# Patient Record
Sex: Female | Born: 1965 | Race: White | Hispanic: No | Marital: Single | Smoking: Current every day smoker
Health system: Southern US, Community
[De-identification: ages and names within clinical notes are randomized; demographics above are authoritative.]

## PROBLEM LIST (undated history)

## (undated) DIAGNOSIS — F419 Anxiety disorder, unspecified: Secondary | ICD-10-CM

## (undated) DIAGNOSIS — E039 Hypothyroidism, unspecified: Secondary | ICD-10-CM

## (undated) DIAGNOSIS — I10 Essential (primary) hypertension: Secondary | ICD-10-CM

## (undated) DIAGNOSIS — F32A Depression, unspecified: Secondary | ICD-10-CM

## (undated) DIAGNOSIS — F329 Major depressive disorder, single episode, unspecified: Secondary | ICD-10-CM

## (undated) DIAGNOSIS — M549 Dorsalgia, unspecified: Secondary | ICD-10-CM

## (undated) DIAGNOSIS — G40909 Epilepsy, unspecified, not intractable, without status epilepticus: Secondary | ICD-10-CM

## (undated) DIAGNOSIS — M797 Fibromyalgia: Secondary | ICD-10-CM

## (undated) DIAGNOSIS — G8929 Other chronic pain: Secondary | ICD-10-CM

## (undated) DIAGNOSIS — M1990 Unspecified osteoarthritis, unspecified site: Secondary | ICD-10-CM

## (undated) HISTORY — DX: Fibromyalgia: M79.7

## (undated) HISTORY — DX: Unspecified osteoarthritis, unspecified site: M19.90

## (undated) HISTORY — PX: APPENDECTOMY: SHX54

## (undated) HISTORY — PX: OTHER SURGICAL HISTORY: SHX169

## (undated) HISTORY — DX: Epilepsy, unspecified, not intractable, without status epilepticus: G40.909

## (undated) HISTORY — PX: ABDOMINAL HYSTERECTOMY: SHX81

## (undated) HISTORY — PX: CHOLECYSTECTOMY: SHX55

## (undated) HISTORY — PX: TONSILLECTOMY: SUR1361

## (undated) HISTORY — PX: BACK SURGERY: SHX140

---

## 2011-04-16 ENCOUNTER — Encounter: Payer: Self-pay | Admitting: Family Medicine

## 2011-04-16 ENCOUNTER — Emergency Department (HOSPITAL_COMMUNITY)
Admission: EM | Admit: 2011-04-16 | Discharge: 2011-04-16 | Disposition: A | Discharge status: Home / Self Care | Payer: BC Managed Care – PPO | Attending: Emergency Medicine | Admitting: Emergency Medicine

## 2011-04-16 DIAGNOSIS — G8929 Other chronic pain: Secondary | ICD-10-CM

## 2011-04-16 DIAGNOSIS — R079 Chest pain, unspecified: Secondary | ICD-10-CM | POA: Insufficient documentation

## 2011-04-16 DIAGNOSIS — R0602 Shortness of breath: Secondary | ICD-10-CM | POA: Insufficient documentation

## 2011-04-16 DIAGNOSIS — F410 Panic disorder [episodic paroxysmal anxiety] without agoraphobia: Secondary | ICD-10-CM | POA: Insufficient documentation

## 2011-04-16 DIAGNOSIS — F319 Bipolar disorder, unspecified: Secondary | ICD-10-CM | POA: Insufficient documentation

## 2011-04-16 DIAGNOSIS — F419 Anxiety disorder, unspecified: Secondary | ICD-10-CM

## 2011-04-16 DIAGNOSIS — F341 Dysthymic disorder: Secondary | ICD-10-CM | POA: Insufficient documentation

## 2011-04-16 DIAGNOSIS — F32A Depression, unspecified: Secondary | ICD-10-CM

## 2011-04-16 DIAGNOSIS — Z79899 Other long term (current) drug therapy: Secondary | ICD-10-CM | POA: Insufficient documentation

## 2011-04-16 DIAGNOSIS — R000 Tachycardia, unspecified: Secondary | ICD-10-CM | POA: Insufficient documentation

## 2011-04-16 DIAGNOSIS — F329 Major depressive disorder, single episode, unspecified: Secondary | ICD-10-CM

## 2011-04-16 HISTORY — DX: Other chronic pain: G89.29

## 2011-04-16 HISTORY — DX: Dorsalgia, unspecified: M54.9

## 2011-04-16 HISTORY — DX: Depression, unspecified: F32.A

## 2011-04-16 HISTORY — DX: Major depressive disorder, single episode, unspecified: F32.9

## 2011-04-16 HISTORY — DX: Anxiety disorder, unspecified: F41.9

## 2011-04-16 MED ORDER — ALPRAZOLAM 0.5 MG PO TABS
1.0000 mg | ORAL_TABLET | Freq: Once | ORAL | Status: AC
  Administered 2011-04-16: 1 mg via ORAL
  Filled 2011-04-16 (×2): qty 1

## 2011-04-16 MED ORDER — SERTRALINE HCL 100 MG PO TABS: 100.0000 mg | ORAL_TABLET | Freq: Every day | ORAL | Status: DC

## 2011-04-16 MED ORDER — ALPRAZOLAM 1 MG PO TABS: ORAL_TABLET | ORAL | Status: DC

## 2011-04-16 NOTE — ED Provider Notes (Signed)
11:29 AM  Date: 04/16/2011  Rate: 81  Rhythm: normal sinus rhythm  QRS Axis: normal  Intervals: normal  ST/T Wave abnormalities: nonspecific T wave changes  Conduction Disutrbances:none  Narrative Interpretation: Borderline EKG.  Old EKG Reviewed: none available  Osvaldo Human, M.D.   Carleene Cooper III, MD 04/17/11 [phone]

## 2011-04-16 NOTE — ED Notes (Signed)
Per EMS, pt chest pain that started yesterday. sts related to stress due to pt husband killing himself. Per EMS pt brother wants her to get help with depression and taking pills

## 2011-04-16 NOTE — ED Provider Notes (Signed)
History     CSN: [id number] Arrival date & time: 04/16/2011 10:53 AM   First MD Initiated Contact with Patient 04/16/11 1146      Chief Complaint  Patient presents with  . Chest Pain    (Consider location/radiation/quality/duration/timing/severity/associated sxs/prior treatment) HPI  Patient presents to emergency department by EMS with her brother at bedside with complaint of anxiety attack. Patient states she has a long history of anxiety, depression, and bipolar disorder as well as chronic pain. Patient states that she has recently moved to [place] from [place] within the last month due to the death of her mother and the death of her husband in 02-01-2023. Patient states since the recent death increasing anxiety and depression. Patient states she's been out of her Zoloft and her Xanax to due to changes in insurance over the last few weeks which has caused increasing anxiety and depression. Patient states that today she was at home were she lives alone and became very overwhelmed suddenly describing an anxiety attack to include shortness of breath, feeling panicked, heart racing, and a pounding in her chest. Patient states similar symptoms in the past when having anxiety attacks. Patient states she has been on daily chronic pain medicines for history of back and leg pain. Patient takes daily morphine, oxycodone, and soma. Patient states she has all her medicines and refills except for her Zoloft and her sertraline. Patient's brother who lives in [place] who is providing much care for the patient since her move is at bedside and states he is concerned that the patient is becoming addicted to her chronic pain medicine. Patient states that she would like refills of her Zoloft and her zoloft. Patient's brother states that he would like resources for information about chronic pain management. Both patient and her brother states that patient feels safe at home and she denies suicidal or  homicidal ideation. Patient states increasing daily anxiety and depression with acute onset anxiety attack which has since resolved. Patient states she is feeling better.  Past Medical History  Diagnosis Date  . Depression   . Anxiety   . Chronic back pain     Past Surgical History  Procedure Date  . Cholecystectomy   . Back surgery   . Tonsillectomy     History reviewed. No pertinent family history.  History  Substance Use Topics  . Smoking status: Current Everyday Smoker  . Smokeless tobacco: Never Used  . Alcohol Use: Yes    OB History    Grav Para Term Preterm Abortions TAB SAB Ect Mult Living                  Review of Systems  All other systems reviewed and are negative.    Allergies  Buspar  Home Medications  No current outpatient prescriptions on file.  BP 124/76  Pulse 88  Temp(Src) 97.9 F (36.6 C) (Oral)  Resp 25  SpO2 98%  Physical Exam  Nursing note and vitals reviewed. Constitutional: She is oriented to person, place, and time. She appears well-developed and well-nourished. No distress.  HENT:  Head: Normocephalic and atraumatic.  Eyes: Conjunctivae are normal.  Neck: Normal range of motion. Neck supple.  Cardiovascular: Normal rate, regular rhythm, normal heart sounds and intact distal pulses.  Exam reveals no gallop and no friction rub.   No murmur heard. Pulmonary/Chest: Effort normal and breath sounds normal. No respiratory distress. She has no wheezes. She has no rales. She exhibits no tenderness.  Abdominal: Bowel  sounds are normal. She exhibits no distension and no mass. There is no tenderness. There is no rebound and no guarding.  Musculoskeletal: Normal range of motion. She exhibits no edema and no tenderness.  Neurological: She is alert and oriented to person, place, and time.  Skin: Skin is warm and dry. No rash noted. She is not diaphoretic. No erythema.  Psychiatric: Her speech is normal. Thought content normal. Her mood  appears anxious. Cognition and memory are not impaired. She does not express impulsivity or inappropriate judgment. She expresses no homicidal and no suicidal ideation.    ED Course  Procedures (including critical care time)  PO xanax  Labs Reviewed - No data to display No results found.   1. Anxiety   2. Depression   3. Chronic pain       MDM  Patient has close family support with brother at bedside and states she feels safe at home denying SI or HI and brother stating that patient is safe at home but needing resources to further discuss chronic pain management. Patient has private insurance that recently was improved and has been seeing a psychologist in town and aggreeable to continuing and following up with psychiatry, PCP and pain management. Patient's only prescriptions that are currently out are zoloft and xanax with patient having a large supply of pain meds and refills on the remainder of her meds.    Medical screening examination/treatment/procedure(s) were performed by non-physician practitioner and as supervising physician I was immediately available for consultation/collaboration. Osvaldo Human, M.D.       [place], [place] 04/16/11 1247  Carleene Cooper III, MD 04/17/11 [phone]

## 2011-04-16 NOTE — ED Notes (Signed)
Pt sts that she is unable to eat or sleep and her heart is racing due to stress and anxiety r/t husbands death

## 2011-04-26 ENCOUNTER — Emergency Department (EMERGENCY_DEPARTMENT_HOSPITAL)
Admission: EM | Admit: 2011-04-26 | Discharge: 2011-04-27 | Disposition: A | Discharge status: Other Acute Inpatient Hospital | Payer: BC Managed Care – PPO | Source: Home / Self Care | Attending: Emergency Medicine | Admitting: Emergency Medicine

## 2011-04-26 ENCOUNTER — Encounter (HOSPITAL_COMMUNITY): Payer: Self-pay | Admitting: Adult Health

## 2011-04-26 ENCOUNTER — Emergency Department (HOSPITAL_COMMUNITY): Payer: BC Managed Care – PPO

## 2011-04-26 DIAGNOSIS — R05 Cough: Secondary | ICD-10-CM

## 2011-04-26 DIAGNOSIS — G8929 Other chronic pain: Secondary | ICD-10-CM

## 2011-04-26 DIAGNOSIS — F329 Major depressive disorder, single episode, unspecified: Secondary | ICD-10-CM

## 2011-04-26 DIAGNOSIS — R197 Diarrhea, unspecified: Secondary | ICD-10-CM

## 2011-04-26 DIAGNOSIS — F419 Anxiety disorder, unspecified: Secondary | ICD-10-CM

## 2011-04-26 DIAGNOSIS — R45851 Suicidal ideations: Secondary | ICD-10-CM

## 2011-04-26 LAB — COMPREHENSIVE METABOLIC PANEL
AST: 18 U/L (ref 0–37)
Albumin: 3.2 g/dL — ABNORMAL LOW (ref 3.5–5.2)
Alkaline Phosphatase: 257 U/L — ABNORMAL HIGH (ref 39–117)
BUN: 3 mg/dL — ABNORMAL LOW (ref 6–23)
Creatinine, Ser: 0.55 mg/dL (ref 0.50–1.10)
Potassium: 3 mEq/L — ABNORMAL LOW (ref 3.5–5.1)
Total Protein: 6.8 g/dL (ref 6.0–8.3)

## 2011-04-26 LAB — ACETAMINOPHEN LEVEL: Acetaminophen (Tylenol), Serum: <15 ug/mL (ref 10–30)

## 2011-04-26 LAB — CBC
HCT: 44.3 % (ref 36.0–46.0)
MCHC: 31.2 g/dL (ref 30.0–36.0)
Platelets: 191 10*3/uL (ref 150–400)
RDW: 14.7 % (ref 11.5–15.5)
WBC: 8.3 10*3/uL (ref 4.0–10.5)

## 2011-04-26 LAB — ETHANOL: Alcohol, Ethyl (B): <11 mg/dL (ref 0–11)

## 2011-04-26 LAB — RAPID URINE DRUG SCREEN, HOSP PERFORMED
Amphetamines: NOT DETECTED
Benzodiazepines: POSITIVE — AB
Cocaine: NOT DETECTED

## 2011-04-26 MED ORDER — POTASSIUM CHLORIDE 20 MEQ/15ML (10%) PO LIQD
40.0000 meq | Freq: Once | ORAL | Status: AC
  Administered 2011-04-26: 40 meq via ORAL
  Filled 2011-04-26: qty 30

## 2011-04-26 MED ORDER — ALPRAZOLAM 1 MG PO TABS
1.0000 mg | ORAL_TABLET | Freq: Four times a day (QID) | ORAL | Status: DC | PRN
  Administered 2011-04-26: 1 mg via ORAL
  Filled 2011-04-26: qty 1

## 2011-04-26 MED ORDER — ZOLPIDEM TARTRATE 5 MG PO TABS
5.0000 mg | ORAL_TABLET | Freq: Every evening | ORAL | Status: DC | PRN
  Administered 2011-04-26: 5 mg via ORAL
  Filled 2011-04-26: qty 1

## 2011-04-26 MED ORDER — LIOTHYRONINE SODIUM 25 MCG PO TABS
25.0000 ug | ORAL_TABLET | Freq: Every day | ORAL | Status: DC
  Administered 2011-04-26 – 2011-04-27 (×2): 25 ug via ORAL
  Filled 2011-04-26 (×3): qty 1

## 2011-04-26 MED ORDER — SODIUM CHLORIDE 0.9 % IV BOLUS (SEPSIS)
1000.0000 mL | Freq: Once | INTRAVENOUS | Status: AC
  Administered 2011-04-26: 1000 mL via INTRAVENOUS

## 2011-04-26 MED ORDER — ACETAMINOPHEN 325 MG PO TABS: 650.0000 mg | ORAL_TABLET | ORAL | Status: DC | PRN

## 2011-04-26 MED ORDER — ONDANSETRON HCL 4 MG/2ML IJ SOLN
4.0000 mg | Freq: Once | INTRAMUSCULAR | Status: AC
  Administered 2011-04-26: 4 mg via INTRAVENOUS
  Filled 2011-04-26: qty 2

## 2011-04-26 MED ORDER — LEVOTHYROXINE SODIUM 200 MCG PO TABS
200.0000 ug | ORAL_TABLET | Freq: Every day | ORAL | Status: DC
  Administered 2011-04-26 – 2011-04-27 (×2): 200 ug via ORAL
  Filled 2011-04-26 (×3): qty 1

## 2011-04-26 MED ORDER — ALUM & MAG HYDROXIDE-SIMETH 200-200-20 MG/5ML PO SUSP
30.0000 mL | ORAL | Status: DC | PRN
  Administered 2011-04-27: 30 mL via ORAL
  Filled 2011-04-26: qty 30

## 2011-04-26 MED ORDER — KETOROLAC TROMETHAMINE 30 MG/ML IJ SOLN
30.0000 mg | Freq: Once | INTRAMUSCULAR | Status: AC
  Administered 2011-04-26: 30 mg via INTRAVENOUS
  Filled 2011-04-26: qty 1

## 2011-04-26 MED ORDER — OXYCODONE-ACETAMINOPHEN 5-325 MG PO TABS
1.0000 | ORAL_TABLET | ORAL | Status: DC | PRN
  Administered 2011-04-26 – 2011-04-27 (×4): 1 via ORAL
  Filled 2011-04-26 (×4): qty 1

## 2011-04-26 MED ORDER — IBUPROFEN 600 MG PO TABS
600.0000 mg | ORAL_TABLET | Freq: Three times a day (TID) | ORAL | Status: DC | PRN
  Administered 2011-04-26: 600 mg via ORAL
  Filled 2011-04-26: qty 1

## 2011-04-26 MED ORDER — LORAZEPAM 1 MG PO TABS: 1.0000 mg | ORAL_TABLET | Freq: Three times a day (TID) | ORAL | Status: DC | PRN

## 2011-04-26 MED ORDER — SODIUM CHLORIDE 0.9 % IV BOLUS (SEPSIS)
1000.0000 mL | Freq: Once | INTRAVENOUS | Status: AC
  Administered 2011-04-26: 2000 mL via INTRAVENOUS

## 2011-04-26 MED ORDER — ONDANSETRON HCL 4 MG PO TABS
4.0000 mg | ORAL_TABLET | Freq: Three times a day (TID) | ORAL | Status: DC | PRN
  Administered 2011-04-27: 4 mg via ORAL
  Filled 2011-04-26: qty 1

## 2011-04-26 MED ORDER — NICOTINE 21 MG/24HR TD PT24
21.0000 mg | MEDICATED_PATCH | Freq: Every day | TRANSDERMAL | Status: DC
  Administered 2011-04-27: 21 mg via TRANSDERMAL
  Filled 2011-04-26: qty 1

## 2011-04-26 NOTE — ED Notes (Signed)
Per [facility], "upon arrival pt was warm & dry, not incontinent, all VS stable/WNL, pupils equal & reactive to light, would not initially allow EMS to open eyes, brother informed EMS her husband killed himself in Sept & she's been wanting to get help for wks"

## 2011-04-26 NOTE — ED Notes (Signed)
Pt states she has been depressed since her husband committed suicide on Sept 27th.  Denies SI/HI.

## 2011-04-26 NOTE — ED Notes (Signed)
Pt c/o that she has the flu, she has no strength, pt is tearful, crying, stating that her husband died and she can't stand being in the house. Keeps repeating, "I don't know what to do"
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
## 2011-04-26 NOTE — ED Notes (Signed)
[id number]<WU> Expected date:04/26/11<BR> Expected time:12:17 PM<BR> Means of arrival:Ambulance<BR> Comments:<BR> M41 - 45yoF suicidal

## 2011-04-26 NOTE — ED Notes (Signed)
One bag belongings placed in activity room.

## 2011-04-26 NOTE — ED Provider Notes (Signed)
History     CSN: [id number] Arrival date & time: 04/26/2011 12:32 PM   First MD Initiated Contact with Patient 04/26/11 1344      No chief complaint on file.   HPI  45yoF history of depression, anxiety presents with multiple complaints. The patient states that since with the suicide of her husband she has been feeling more depressed and more anxious. She states "I can't take it anymore" she states that she ran out of her Xanax approximately 2 weeks ago. She has thoughts of wanting to harm herself. She denies homicidal ideation. She denies auditory or visual hallucinations. She also states that for the last 2 weeks she has been doing with multiple episodes of watery diarrhea per day. She denies blood in her stool. She's had nausea and vomiting. +NP cough. Her last episode of nonbilious nonbloody emesis was yesterday. She denies abdominal pain. She does complain of diffuse body aches. She's having a mild diffuse headache, sore throat for the past 2 weeks as well. She has chills, no fever. No sick contacts.  ED Notes, ED Provider Notes from 04/26/11 0000 to 04/26/11 12:48:16       Janett Billow Delories Heinz, RN 04/26/2011 12:46      Pt c/o that she has the flu, she has no strength, pt is tearful, crying, stating that her husband died and she can't stand being in the house. Keeps repeating, "I don't know what to do"         Dixie Dials, RN 04/26/2011 12:32      [id number]  Expected date:04/26/11  Expected time:12:17 PM  Means of arrival:Ambulance  Comments:  M41 - 45yoF suicidal         Dixie Dials, RN 04/26/2011 12:32      Per [facility], "upon arrival pt was warm & dry, not incontinent, all VS stable/WNL, pupils equal & reactive to light, would not initially allow EMS to open eyes, brother informed EMS her husband killed himself in Sept & she's been wanting to get help for wks"    Past Medical History  Diagnosis Date  . Depression   . Anxiety   . Chronic back pain     Past  Surgical History  Procedure Date  . Cholecystectomy   . Back surgery   . Tonsillectomy   . Appendectomy     History reviewed. No pertinent family history.  History  Substance Use Topics  . Smoking status: Current Everyday Smoker  . Smokeless tobacco: Never Used  . Alcohol Use: Yes    OB History    Grav Para Term Preterm Abortions TAB SAB Ect Mult Living                  Review of Systems  All other systems reviewed and are negative.  except as noted HPI  Allergies  Buspar  Home Medications   Current Outpatient Rx  Name Route Sig Dispense Refill  . ALPRAZOLAM 1 MG PO TABS  1 tab by mouth every 6 hours as needed 30 tablet 0  . CARISOPRODOL 350 MG PO TABS Oral Take 350 mg by mouth 4 (four) times daily as needed. As needed for muscle spasms.     . CETIRIZINE HCL 10 MG PO TABS Oral Take 10 mg by mouth daily.      Marland Kitchen LEVOTHYROXINE SODIUM 200 MCG PO TABS Oral Take 200 mcg by mouth daily.      Marland Kitchen LIOTHYRONINE SODIUM 25 MCG PO TABS Oral Take 25 mcg by  mouth daily.      . MORPHINE SULFATE ER 60 MG PO TB12 Oral Take 60 mg by mouth 2 (two) times daily as needed. For pain    . OXYCODONE HCL 15 MG PO TABS Oral Take 15 mg by mouth every 4 (four) hours as needed. For breakthrough pain.     . ALBUTEROL SULFATE HFA 108 (90 BASE) MCG/ACT IN AERS Inhalation Inhale 2 puffs into the lungs every 4 (four) hours as needed. As needed for wheezing.     . DESOXIMETASONE 0.05 % EX CREA Topical Apply 1 application topically 2 (two) times daily as needed. Apply to toes as needed for irritation.     Marland Kitchen POTASSIUM CHLORIDE 10 MEQ PO TBCR Oral Take 20 mEq by mouth daily.      . QUETIAPINE FUMARATE 50 MG PO TABS Oral Take 50 mg by mouth at bedtime.      . SERTRALINE HCL 100 MG PO TABS Oral Take 100 mg by mouth daily.      . TOPIRAMATE 100 MG PO TABS Oral Take 100-200 mg by mouth 2 (two) times daily. Take one tablet in the morning and two tablets every night at bedtime.       BP 115/72  Pulse 60   Temp(Src) 97.9 F (36.6 C) (Oral)  Resp 18  SpO2 95%  Physical Exam  Nursing note and vitals reviewed. Constitutional: She is oriented to person, place, and time. She appears well-developed.  HENT:  Head: Atraumatic.  Mouth/Throat: Oropharynx is clear and moist. No oropharyngeal exudate.       Mm dry  Eyes: Conjunctivae and EOM are normal. Pupils are equal, round, and reactive to light.  Neck: Normal range of motion. Neck supple.  Cardiovascular: Normal rate, regular rhythm, normal heart sounds and intact distal pulses.   Pulmonary/Chest: Effort normal and breath sounds normal. No respiratory distress. She has no wheezes. She has no rales.  Abdominal: Soft. She exhibits no distension. There is no tenderness. There is no rebound and no guarding.  Musculoskeletal: Normal range of motion.       No muscular ttp  Neurological: She is alert and oriented to person, place, and time.  Skin: Skin is warm and dry. No rash noted.  Psychiatric: She has a normal mood and affect.    ED Course  Procedures (including critical care time)  Labs Reviewed  COMPREHENSIVE METABOLIC PANEL - Abnormal; Notable for the following:    Potassium 3.0 (*)    Glucose, Bld 101 (*)    BUN 3 (*)    Albumin 3.2 (*)    Alkaline Phosphatase 257 (*)    All other components within normal limits  URINE RAPID DRUG SCREEN (HOSP PERFORMED) - Abnormal; Notable for the following:    Opiates POSITIVE (*)    Benzodiazepines POSITIVE (*)    All other components within normal limits  LIPASE, BLOOD - Abnormal; Notable for the following:    Lipase 8 (*)    All other components within normal limits  CBC  ETHANOL  ACETAMINOPHEN LEVEL  RAPID STREP SCREEN  POCT PREGNANCY, URINE  STREP A DNA PROBE   Dg Chest 2 View  04/26/2011  *RADIOLOGY REPORT*  Clinical Data: Cough.  Fever.  Diarrhea.  Vomiting.  CHEST - 2 VIEW  Comparison: None.  Findings: Heart size is normal.  Both lungs are clear.  No evidence of pleural effusion.   No mass or lymphadenopathy identified.  Multiple old right lateral rib fracture deformities are seen.  IMPRESSION: No active cardiopulmonary disease.  Original Report Authenticated By: Danae Orleans, M.D.     1. Depression   2. Suicidal ideation   3. Diarrhea   4. Cough   5. Anxiety   6. Chronic pain     MDM  Pt presents with multiple complaints, primarily anxiety and depression ?SI. She has mild dehydration from the diarrhea she has experience over the last 2 weeks. Her last episode of diarrhea was yesterday. I do not suspect an infectious cause. Workup here is remarkable for hypokalemia. Repletion process. Labs otherwise unremarkable. Patient is medically cleared. I discussed her case with ACT who will assess the patient in the emergency department.        Forbes Cellar, MD 04/26/11 [phone]

## 2011-04-27 ENCOUNTER — Encounter (HOSPITAL_COMMUNITY): Payer: Self-pay | Admitting: *Deleted

## 2011-04-27 ENCOUNTER — Inpatient Hospital Stay (HOSPITAL_COMMUNITY)
Admission: RE | Admit: 2011-04-27 | Discharge: 2011-05-02 | DRG: 430 | Disposition: A | Discharge status: Home / Self Care | Payer: BC Managed Care – PPO | Source: Ambulatory Visit | Attending: Emergency Medicine | Admitting: Emergency Medicine

## 2011-04-27 DIAGNOSIS — F411 Generalized anxiety disorder: Secondary | ICD-10-CM

## 2011-04-27 DIAGNOSIS — IMO0002 Reserved for concepts with insufficient information to code with codable children: Secondary | ICD-10-CM

## 2011-04-27 DIAGNOSIS — Z79899 Other long term (current) drug therapy: Secondary | ICD-10-CM

## 2011-04-27 DIAGNOSIS — I10 Essential (primary) hypertension: Secondary | ICD-10-CM

## 2011-04-27 DIAGNOSIS — F17200 Nicotine dependence, unspecified, uncomplicated: Secondary | ICD-10-CM

## 2011-04-27 DIAGNOSIS — R45851 Suicidal ideations: Secondary | ICD-10-CM

## 2011-04-27 DIAGNOSIS — G40909 Epilepsy, unspecified, not intractable, without status epilepticus: Secondary | ICD-10-CM

## 2011-04-27 DIAGNOSIS — G8929 Other chronic pain: Secondary | ICD-10-CM

## 2011-04-27 DIAGNOSIS — E039 Hypothyroidism, unspecified: Secondary | ICD-10-CM

## 2011-04-27 DIAGNOSIS — F329 Major depressive disorder, single episode, unspecified: Principal | ICD-10-CM

## 2011-04-27 DIAGNOSIS — G4700 Insomnia, unspecified: Secondary | ICD-10-CM

## 2011-04-27 DIAGNOSIS — Z6833 Body mass index (BMI) 33.0-33.9, adult: Secondary | ICD-10-CM

## 2011-04-27 DIAGNOSIS — F1310 Sedative, hypnotic or anxiolytic abuse, uncomplicated: Secondary | ICD-10-CM

## 2011-04-27 DIAGNOSIS — R569 Unspecified convulsions: Secondary | ICD-10-CM

## 2011-04-27 HISTORY — DX: Hypothyroidism, unspecified: E03.9

## 2011-04-27 HISTORY — DX: Essential (primary) hypertension: I10

## 2011-04-27 LAB — BASIC METABOLIC PANEL
BUN: 6 mg/dL (ref 6–23)
Calcium: 8.1 mg/dL — ABNORMAL LOW (ref 8.4–10.5)
Creatinine, Ser: 0.64 mg/dL (ref 0.50–1.10)
GFR calc non Af Amer: >90 mL/min (ref >90)
Glucose, Bld: 87 mg/dL (ref 70–99)
Sodium: 138 mEq/L (ref 135–145)

## 2011-04-27 LAB — POCT I-STAT, CHEM 8
Calcium, Ion: 1.09 mmol/L — ABNORMAL LOW (ref 1.12–1.32)
Glucose, Bld: 85 mg/dL (ref 70–99)
HCT: 46 % (ref 36.0–46.0)
Hemoglobin: 15.6 g/dL — ABNORMAL HIGH (ref 12.0–15.0)
TCO2: 27 mmol/L (ref 0–100)

## 2011-04-27 LAB — STREP A DNA PROBE

## 2011-04-27 MED ORDER — LEVOTHYROXINE SODIUM 200 MCG PO TABS
200.0000 ug | ORAL_TABLET | Freq: Every day | ORAL | Status: DC
  Administered 2011-04-27 – 2011-05-02 (×6): 200 ug via ORAL
  Filled 2011-04-27 (×8): qty 1

## 2011-04-27 MED ORDER — SERTRALINE HCL 50 MG PO TABS
200.0000 mg | ORAL_TABLET | Freq: Every day | ORAL | Status: DC
  Administered 2011-04-27: 200 mg via ORAL
  Filled 2011-04-27: qty 4

## 2011-04-27 MED ORDER — AMITRIPTYLINE HCL 25 MG PO TABS
25.0000 mg | ORAL_TABLET | Freq: Every day | ORAL | Status: DC
  Administered 2011-04-27 – 2011-04-28 (×2): 25 mg via ORAL
  Filled 2011-04-27 (×3): qty 1

## 2011-04-27 MED ORDER — LIDOCAINE 5 % EX PTCH
1.0000 | MEDICATED_PATCH | CUTANEOUS | Status: DC
  Administered 2011-04-27: 1 via TRANSDERMAL
  Filled 2011-04-27 (×6): qty 1

## 2011-04-27 MED ORDER — ARIPIPRAZOLE 2 MG PO TABS
2.0000 mg | ORAL_TABLET | Freq: Every day | ORAL | Status: DC
  Administered 2011-04-27: 2 mg via ORAL
  Filled 2011-04-27 (×2): qty 1

## 2011-04-27 MED ORDER — ALBUTEROL SULFATE HFA 108 (90 BASE) MCG/ACT IN AERS: 2.0000 | INHALATION_SPRAY | RESPIRATORY_TRACT | Status: DC | PRN

## 2011-04-27 MED ORDER — LIOTHYRONINE SODIUM 25 MCG PO TABS
25.0000 ug | ORAL_TABLET | Freq: Every day | ORAL | Status: DC
  Administered 2011-04-27 – 2011-05-02 (×6): 25 ug via ORAL
  Filled 2011-04-27 (×8): qty 1

## 2011-04-27 MED ORDER — TOPIRAMATE 100 MG PO TABS
100.0000 mg | ORAL_TABLET | Freq: Two times a day (BID) | ORAL | Status: DC
  Administered 2011-04-27 – 2011-04-28 (×2): 100 mg via ORAL
  Filled 2011-04-27 (×2): qty 2
  Filled 2011-04-27: qty 1

## 2011-04-27 MED ORDER — TRIAMCINOLONE ACETONIDE 0.025 % EX CREA
TOPICAL_CREAM | Freq: Two times a day (BID) | CUTANEOUS | Status: DC
  Administered 2011-04-27 – 2011-04-29 (×2): via TOPICAL
  Filled 2011-04-27: qty 15

## 2011-04-27 MED ORDER — LORATADINE 10 MG PO TABS
10.0000 mg | ORAL_TABLET | Freq: Every day | ORAL | Status: DC
  Administered 2011-04-27 – 2011-05-02 (×6): 10 mg via ORAL
  Filled 2011-04-27 (×8): qty 1
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
## 2011-04-27 NOTE — Progress Notes (Signed)
Patient ID: Brenda Allison, female   DOB: 15-Mar-1966, 45 y.o.   MRN: [id number] Patient denied SI and HI.   Denied A/V hallucinations.   Denied pain.   Lidocaine patch applied to lower back.   Patient cooperative and pleasant.

## 2011-04-27 NOTE — Consult Note (Signed)
Patient Identification:  Brenda Allison Date of Evaluation:  04/27/2011   History of Present Illness:  Patient seen in assessment reviewed. 45 year old Caucasian female with history of depression reported depressed mood and suicidal ideations without a specific plan. Patient also reported poor sleep and appetite. Feels hopeless and helpless at this point. Her husband killed himself in September 02/09/11. Patient is logical and goal directed not hallucinating or delusional. She is currently on Zoloft and Xanax she has a history of chronic back pain I discussed with her about Cymbalta and she told me she don't like Cymbalta she is very fearful from this medication as her husband suicidal ideations increased on this medications. She also don't want to take the Seroquel. I told her I would increase the Zoloft to 200 mg and add Abilify to 2 milligram at bedtime to augment the Zoloft patient agreed with this treatment plan.   Past Medical History:     Past Medical History  Diagnosis Date  . Depression   . Anxiety   . Chronic back pain        Past Surgical History  Procedure Date  . Cholecystectomy   . Back surgery   . Tonsillectomy   . Appendectomy     Allergies:  Allergies  Allergen Reactions  . Buspar (Buspirone Hcl)     "feels like she is going to hit the ground".     Current Medications:  Prior to Admission medications   Medication Sig Start Date End Date Taking? Authorizing Provider  ALPRAZolam Prudy Feeler) 1 MG tablet 1 tab by mouth every 6 hours as needed 04/16/11  Yes Jenness [place], PA  carisoprodol (SOMA) 350 MG tablet Take 350 mg by mouth 4 (four) times daily as needed. As needed for muscle spasms.    Yes Historical Provider, MD  cetirizine (ZYRTEC) 10 MG tablet Take 10 mg by mouth daily.     Yes Historical Provider, MD  levothyroxine (SYNTHROID, LEVOTHROID) 200 MCG tablet Take 200 mcg by mouth daily.     Yes Historical Provider, MD  liothyronine (CYTOMEL) 25 MCG tablet Take 25  mcg by mouth daily.     Yes Historical Provider, MD  morphine (MS CONTIN) 60 MG 12 hr tablet Take 60 mg by mouth 2 (two) times daily as needed. For pain   Yes Historical Provider, MD  oxyCODONE (ROXICODONE) 15 MG immediate release tablet Take 15 mg by mouth every 4 (four) hours as needed. For breakthrough pain.    Yes Historical Provider, MD  albuterol (PROVENTIL HFA;VENTOLIN HFA) 108 (90 BASE) MCG/ACT inhaler Inhale 2 puffs into the lungs every 4 (four) hours as needed. As needed for wheezing.     Historical Provider, MD  desoximetasone (TOPICORT) 0.05 % cream Apply 1 application topically 2 (two) times daily as needed. Apply to toes as needed for irritation.     Historical Provider, MD  potassium chloride (KLOR-CON) 10 MEQ CR tablet Take 20 mEq by mouth daily.      Historical Provider, MD  QUEtiapine (SEROQUEL) 50 MG tablet Take 50 mg by mouth at bedtime.      Historical Provider, MD  sertraline (ZOLOFT) 100 MG tablet Take 100 mg by mouth daily.      Historical Provider, MD  topiramate (TOPAMAX) 100 MG tablet Take 100-200 mg by mouth 2 (two) times daily. Take one tablet in the morning and two tablets every night at bedtime.     Historical Provider, MD    Social History:    reports that  she has been smoking.  She has never used smokeless tobacco. She reports that she drinks alcohol. She reports that she does not use illicit drugs.   Family History:    History reviewed. No pertinent family history.   DIAGNOSIS:   AXIS I  Maj. depressive disorder recurrent type, anxiety disorder NOS   AXIS II  Deffered  AXIS III See medical notes.  AXIS IV  recent death of the husband   AXIS V 35     Recommendations:  Patient will be admitted in the inpatient setting for further stabilization.   Eulogio Ditch, MD

## 2011-04-27 NOTE — ED Notes (Signed)
Pt's brother Hortencia Conradi phone number is [phone]

## 2011-04-27 NOTE — Discharge Planning (Signed)
Patient has been accepted to [facility] by Dr. Dan Humphreys bed [phone]. Patient's support paperwork has been completed. EDP notified and is in agreement with disposition. EDP will discharge pt to [facility]. Pt nurse notified as well. ALL appropriate paperwork completed and forwarded to [facility] for review.   Ileene Hutchinson , MSW, LCSWA 04/27/2011 12:18 PM

## 2011-04-28 ENCOUNTER — Encounter (HOSPITAL_COMMUNITY): Payer: Self-pay | Admitting: Emergency Medicine

## 2011-04-28 DIAGNOSIS — R569 Unspecified convulsions: Secondary | ICD-10-CM

## 2011-04-28 DIAGNOSIS — F1310 Sedative, hypnotic or anxiolytic abuse, uncomplicated: Secondary | ICD-10-CM

## 2011-04-28 LAB — URINALYSIS, ROUTINE W REFLEX MICROSCOPIC
Bilirubin Urine: NEGATIVE
Leukocytes, UA: NEGATIVE
Nitrite: NEGATIVE
Protein, ur: 30 mg/dL — AB
Urobilinogen, UA: 0.2 mg/dL (ref 0.0–1.0)

## 2011-04-28 LAB — GLUCOSE, CAPILLARY: Glucose-Capillary: 119 mg/dL — ABNORMAL HIGH (ref 70–99)

## 2011-04-28 LAB — POCT I-STAT, CHEM 8
Calcium, Ion: 1.14 mmol/L (ref 1.12–1.32)
Glucose, Bld: 100 mg/dL — ABNORMAL HIGH (ref 70–99)
HCT: 49 % — ABNORMAL HIGH (ref 36.0–46.0)
Hemoglobin: 16.7 g/dL — ABNORMAL HIGH (ref 12.0–15.0)
TCO2: 22 mmol/L (ref 0–100)

## 2011-04-28 MED ORDER — CARBAMAZEPINE ER 400 MG PO TB12
600.0000 mg | ORAL_TABLET | Freq: Every day | ORAL | Status: DC
  Administered 2011-04-28: 600 mg via ORAL
  Filled 2011-04-28: qty 3
  Filled 2011-04-28 (×2): qty 1

## 2011-04-28 MED ORDER — LORAZEPAM 2 MG/ML IJ SOLN
2.0000 mg | Freq: Once | INTRAMUSCULAR | Status: AC
  Administered 2011-04-28: 2 mg via INTRAMUSCULAR

## 2011-04-28 MED ORDER — TOPIRAMATE 100 MG PO TABS
200.0000 mg | ORAL_TABLET | Freq: Every day | ORAL | Status: DC
  Administered 2011-04-28 – 2011-05-01 (×4): 200 mg via ORAL
  Filled 2011-04-28 (×5): qty 2

## 2011-04-28 MED ORDER — CARBAMAZEPINE 200 MG PO TABS: 200.0000 mg | ORAL_TABLET | Freq: Three times a day (TID) | ORAL | Status: DC

## 2011-04-28 MED ORDER — IBUPROFEN 400 MG PO TABS
400.0000 mg | ORAL_TABLET | ORAL | Status: DC | PRN
  Administered 2011-04-28 – 2011-04-29 (×3): 400 mg via ORAL
  Filled 2011-04-28 (×2): qty 1

## 2011-04-28 MED ORDER — IBUPROFEN 400 MG PO TABS
ORAL_TABLET | ORAL | Status: AC
  Administered 2011-04-28: 400 mg via ORAL
  Filled 2011-04-28: qty 1

## 2011-04-28 MED ORDER — TOPIRAMATE 100 MG PO TABS
100.0000 mg | ORAL_TABLET | Freq: Every day | ORAL | Status: DC
  Administered 2011-04-29 – 2011-05-02 (×4): 100 mg via ORAL
  Filled 2011-04-28 (×5): qty 1

## 2011-04-28 MED ORDER — POTASSIUM CHLORIDE CRYS ER 20 MEQ PO TBCR
40.0000 meq | EXTENDED_RELEASE_TABLET | Freq: Once | ORAL | Status: AC
  Administered 2011-04-28: 40 meq via ORAL
  Filled 2011-04-28: qty 2

## 2011-04-28 MED ORDER — CARBAMAZEPINE 200 MG PO TABS
200.0000 mg | ORAL_TABLET | ORAL | Status: AC
  Administered 2011-04-28: 200 mg via ORAL
  Filled 2011-04-28: qty 1

## 2011-04-28 MED ORDER — HYDROXYZINE HCL 50 MG PO TABS
50.0000 mg | ORAL_TABLET | Freq: Once | ORAL | Status: AC
  Administered 2011-04-28: 50 mg via ORAL

## 2011-04-28 NOTE — ED Notes (Signed)
Pt states that she was on the toilet and bent over to pick toilet paper up off the floor when she had a seizure.  Remembers hitting her head (rt side of forehead) on the handicap rail.  States hx of same.  Takes topamax.  C/o being tired, pain in lower back (chronic).  Vitals stable.  NAD.

## 2011-04-28 NOTE — ED Notes (Signed)
BJY:[id number]<FA> Expected date:04/28/11<BR> Expected time:11:06 AM<BR> Means of arrival:Ambulance<BR> Comments:<BR> M20 - 45yoF Seizure with hx.  Fr behavioral health

## 2011-04-28 NOTE — Progress Notes (Addendum)
Patient ID: Brenda Allison, female   DOB: Nov 24, 1965, 45 y.o.   MRN: [id number] Pt admitted last PM.  Pt had history of pain.  She denied any cardiac problems through the intake professional and my phone conversation.  She was started on Elavil and Lidoderm for pain management.    She had awakened this AM and was conversant with staff.  She was ambulatory and came in to the treatment team meeting and sat in a chair on her own.  She was asked why she came into the hospital.  She reported her husband had committed suicide.  She gave that same answer to several questions about him, their relationship, and her feelings since he had committed suicide.  She gave no response and was asked if she heard [place].  She continued in her stare and then became stiff and then proceeded to have clonic jerks equally on both sides.   She has a history of siezures.  It was noted that she had been in the emergency department and originally had Na of 138, K of 5.6, and BUN WNL at 0122 then at 1449 her labs dramatically changed to Na of 146, K of 3.6, and BUN of <3.  This could have been the back drop setting her up for another seizure after being put on Elavil for 1 dose, then stressed about having to recount the trauma of her husband committing suicide in the middle of the night   Pt sent to ED at 10:50 AM status post seizure.

## 2011-04-28 NOTE — Progress Notes (Signed)
Pt attended discharge planning group and actively participated.  Pt presents with tearful affect and depressed mood.  Pt ranks depression and anxiety at a 5 today.  Pt denies SI today.  Pt was open with sharing reason for entering the hospital.  Pt states her husband committed suicide in September of this year.  Pt states he did it while she was in the house.  Pt states she is currently frustrated about his decision to end his life and is having a hard time moving forward from this loss.  Pt states she also lost her mother to suicide 10 years ago and this is sad for her as well.  Pt states she has no support and lives in [place] alone now.  Pt was then unable to speak further in d/c planning group stating she hears the questions but has no answers.  Safety planning and suicide prevention discussed.     Met with pt in treatment team at this time.  Dr asked questions about pt's husband and pt went silent in a daze.  Pt then had a seizure and had a Code Blue and had to be taken to the ER.    Reyes Ivan, LCSWA 04/28/2011  11:06 AM

## 2011-04-28 NOTE — Progress Notes (Addendum)
Patient ID: Brenda Allison, female   DOB: 1965/08/24, 45 y.o.   MRN: [id number] Pt returned from the ED after seizure.  She complained of a headache, ibuprofen given with some relief.  Pt did sleep from about 7-10 pm, awakened for her meds.  She requested ibuprofen again for a headache, effective.  Multiple requests throughout the night when awake.

## 2011-04-28 NOTE — ED Notes (Signed)
One blood culture set sent to lab

## 2011-04-28 NOTE — ED Provider Notes (Signed)
History     CSN: [id number] Arrival date & time: 04/27/2011  3:54 PM   First MD Initiated Contact with Patient 04/28/11 1139      Chief Complaint  Patient presents with  . Seizures    (Consider location/radiation/quality/duration/timing/severity/associated sxs/prior treatment) HPI Patient presents after having episode of generalized tonic-clonic seizure this morning while undergoing treatment at behavioral health. She was admitted for depression and apparently was in her treatment team meeting and discussing her difficulty after her husband's recent suicide when she began to become unresponsive and had generalized tonic-clonic seizure. Patient was given Ativan IM and seizure resolved. EMS called and transported patient to the emergency department for further evaluation. Upon my initial evaluation patient had no seizure activity was alert and oriented and complains only of mild headache. She states she does have headaches after seizures. She states that she has had approximately 4-5 seizures and the last one being several months to years ago. She denies any recent illness. She does not have any memory of the seizure episode today.  There are no other alleviating or modifying factors, no other systemic symptoms   Past Medical History  Diagnosis Date  . Depression   . Anxiety   . Chronic back pain   . Hypertension   . Hypothyroidism     Past Surgical History  Procedure Date  . Cholecystectomy   . Back surgery   . Tonsillectomy   . Appendectomy     No family history on file.  History  Substance Use Topics  . Smoking status: Current Everyday Smoker  . Smokeless tobacco: Never Used  . Alcohol Use: Yes    OB History    Grav Para Term Preterm Abortions TAB SAB Ect Mult Living                  Review of Systems ROS reviewed and otherwise negative except for mentioned in HPI  Allergies  Buspar  Home Medications   Current Outpatient Rx  Name Route Sig Dispense Refill    . ALBUTEROL SULFATE HFA 108 (90 BASE) MCG/ACT IN AERS Inhalation Inhale 2 puffs into the lungs every 4 (four) hours as needed. As needed for wheezing.     Marland Kitchen CETIRIZINE HCL 10 MG PO TABS Oral Take 10 mg by mouth daily.      . DESOXIMETASONE 0.05 % EX CREA Topical Apply 1 application topically 2 (two) times daily as needed. Apply to toes as needed for irritation.     Marland Kitchen LEVOTHYROXINE SODIUM 200 MCG PO TABS Oral Take 200 mcg by mouth daily.      Marland Kitchen LIOTHYRONINE SODIUM 25 MCG PO TABS Oral Take 25 mcg by mouth daily.      . TOPIRAMATE 100 MG PO TABS Oral Take 100-200 mg by mouth 2 (two) times daily. Take one tablet in the morning and two tablets every night at bedtime.       BP 100/52  Pulse 66  Temp(Src) 99.2 F (37.3 C) (Oral)  Resp 24  Ht 5' 2.5" (1.588 m)  Wt 188 lb (85.276 kg)  BMI 33.84 kg/m2  SpO2 96% Vitals reviewed Physical Exam Physical Examination: General appearance - alert, well appearing, and in no distress Mental status - alert, oriented to person, place, and time Eyes - pupils equal and reactive, extraocular eye movements intact Mouth - mucous membranes moist, pharynx normal without lesions Chest - clear to auscultation, no wheezes, rales or rhonchi, symmetric air entry Heart - normal rate, regular rhythm, normal S1,  S2, no murmurs, rubs, clicks or gallops Abdomen - soft, nontender, nondistended, no masses or organomegaly Neurological - alert, oriented, normal speech, no focal findings or movement disorder noted, cranial nerves 2-12 tested and intact Musculoskeletal - no joint tenderness, deformity or swelling Extremities - peripheral pulses normal, no pedal edema, no clubbing or cyanosis Skin - normal coloration and turgor, no rashes  ED Course  Procedures (including critical care time)  Labs Reviewed  GLUCOSE, CAPILLARY - Abnormal; Notable for the following:    Glucose-Capillary 119 (*)    All other components within normal limits  I-STAT, CHEM 8  URINALYSIS,  ROUTINE W REFLEX MICROSCOPIC   Dg Chest 2 View  04/26/2011  *RADIOLOGY REPORT*  Clinical Data: Cough.  Fever.  Diarrhea.  Vomiting.  CHEST - 2 VIEW  Comparison: None.  Findings: Heart size is normal.  Both lungs are clear.  No evidence of pleural effusion.  No mass or lymphadenopathy identified.  Multiple old right lateral rib fracture deformities are seen.  IMPRESSION: No active cardiopulmonary disease.  Original Report Authenticated By: Danae Orleans, M.D.     No diagnosis found.    MDM  Patient presenting from behavioral health after seizure. Her laboratory evaluation including CBC electrolytes and urine was reassuring in ED. Her neurologic exam is normal and she is back to her baseline mental status. She does take Topamax and she is to continue taking meds. She was discharged to return to behavioral health to continue her treatment for depression fair. Patient is agreeable with this plan and given strict return precautions        Ethelda Chick, MD 04/28/11 1606

## 2011-04-28 NOTE — Progress Notes (Signed)
Patient came to treatment team to speak with the team including case managers, psychiatrist, and nurses. Treatment team started asking her questions surrounding her husband's death and what had brought her into the hospital. Patient stopped answering questions and was staring. She started making a sound and then started shaking. Patient became unresponsive to staff. Code blue called for reinforcement. Patient has a hx of seizures and this appeared to be that. Ativan 2mg  IM given in lt deltoid. EMS called by other nurse at this time. Patient did urinate on self and could not tell us where she was after she stopped shaking. Transported to ED for an evaluation and stabilization.
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
## 2011-04-28 NOTE — Progress Notes (Signed)
Pt. Complains that  She is teary and anxious. Writer notified on call [facility] PA, received for Hydroxyzine 50 mg po for anxiety/sleep.  Writer educated pt. On meds and administered without incidence. Writer reassess accordingly. Staff will continue to monitor q50min for safety.

## 2011-04-28 NOTE — ED Notes (Signed)
Pt had seizure at [facility] (hx of same).

## 2011-04-28 NOTE — ED Notes (Signed)
Pt c/o diarrhea and rectal burning.  Is requesting antidiarrheal med.

## 2011-04-28 NOTE — Tx Team (Signed)
Interdisciplinary Treatment Plan Update (Adult)  Date:  04/28/2011  Time Reviewed:  11:07 AM   Progress in Treatment: Attending groups: Yes Participating in groups:  Yes Taking medication as prescribed: Yes Tolerating medication:  Yes Family/Significant othe contact made:  Counselor assessing for appropriate contact Patient understands diagnosis:  Yes Discussing patient identified problems/goals with staff:  Yes Medical problems stabilized or resolved:  Yes Denies suicidal/homicidal ideation: Yes Issues/concerns per patient self-inventory:  None identified Other: N/A  New problem(s) identified: None Identified  Reason for Continuation of Hospitalization: Anxiety Depression Medical Issues Medication stabilization  Interventions implemented related to continuation of hospitalization: mood stabilization, medication monitoring and adjustment, group therapy and psycho education, safety checks q 15 mins  Additional comments: N/A  Estimated length of stay: 3-5 days  Discharge Plan: SW will assess for appropriate referrals for medication management and therapy.    New goal(s): N/A  Review of initial/current patient goals per problem list:    1.  Goal(s): Reduce depressive symptoms  Met:  No  Target date: by discharge  As evidenced by: Reducing depression from a 10 to a 3 as reported by pt. Pt ranks at a 5 today.    2.  Goal (s): Reduce/Eliminate suicidal ideation  Met:  No  Target date: by discharge  As evidenced by: pt reporting no SI.    3.  Goal(s): Reduce anxiety  Met:  No  Target date: by discharge  As evidenced by: Reduce anxiety from a 10 to a 3 as reported by pt. Pt ranks at a 5 today.     Attendees: Patient:  Brenda Allison 04/28/2011 10:30 am  Family:     Physician:  Orson Aloe, MD  04/28/2011  10:30 am  Nursing:   Manuela Schwartz, RN 04/28/2011 10:30 am  Case Manager:  Reyes Ivan, LCSWA 04/28/2011  10:30 am  Counselor:    Other:  Juline Patch, LCSW 04/28/2011  10:30 am   Other:     Other:     Other:      Scribe for Treatment Team:   Carmina Miller, 04/28/2011 , 11:07 AM

## 2011-04-28 NOTE — Progress Notes (Signed)
Patient ID: Brenda Allison, female   DOB: 23-Apr-1966, 45 y.o.   MRN: [id number]  Patient pleasant on approach this am. Reports increased depression recently. Gives depression "8" and hopelessness "7" on scale today. Currently denies any SI at present. Does feel that she needs medication for anxiety and depression. Reports really poor sleep last night and physician on-call had to be notified because of this and increased anxiety. Staff will continue to monitor and encourage group attendance.

## 2011-04-28 NOTE — ED Notes (Signed)
Pt resting.  Requesting blanket and given.  [facility] sitter at b/s.  nad noted. Lights out for comfort.
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
## 2011-04-28 NOTE — ED Notes (Signed)
EAV:[id number]<BJ> Expected date:04/28/11<BR> Expected time: 7:20 PM<BR> Means of arrival:Ambulance<BR> Comments:<BR> EMS 211 GC, 25 yom seizures Med non-compliance

## 2011-04-29 DIAGNOSIS — R45851 Suicidal ideations: Secondary | ICD-10-CM

## 2011-04-29 DIAGNOSIS — F411 Generalized anxiety disorder: Secondary | ICD-10-CM

## 2011-04-29 DIAGNOSIS — F329 Major depressive disorder, single episode, unspecified: Principal | ICD-10-CM

## 2011-04-29 MED ORDER — THIAMINE HCL 100 MG/ML IJ SOLN
100.0000 mg | Freq: Once | INTRAMUSCULAR | Status: AC
  Administered 2011-04-29: 100 mg via INTRAMUSCULAR

## 2011-04-29 MED ORDER — LOPERAMIDE HCL 2 MG PO CAPS
2.0000 mg | ORAL_CAPSULE | ORAL | Status: AC | PRN
  Administered 2011-04-29: 2 mg via ORAL
  Administered 2011-04-29: 4 mg via ORAL
  Administered 2011-04-29 – 2011-05-01 (×3): 2 mg via ORAL
  Filled 2011-04-29 (×2): qty 1

## 2011-04-29 MED ORDER — THERA M PLUS PO TABS
1.0000 | ORAL_TABLET | Freq: Every day | ORAL | Status: DC
  Administered 2011-04-29 – 2011-05-02 (×4): 1 via ORAL
  Filled 2011-04-29 (×5): qty 1

## 2011-04-29 MED ORDER — HYDROXYZINE HCL 25 MG PO TABS
25.0000 mg | ORAL_TABLET | Freq: Four times a day (QID) | ORAL | Status: AC | PRN
  Administered 2011-04-29 – 2011-05-01 (×3): 25 mg via ORAL

## 2011-04-29 MED ORDER — CHLORDIAZEPOXIDE HCL 25 MG PO CAPS: 25.0000 mg | ORAL_CAPSULE | Freq: Four times a day (QID) | ORAL | Status: DC | PRN

## 2011-04-29 MED ORDER — VITAMIN B-1 100 MG PO TABS
100.0000 mg | ORAL_TABLET | Freq: Every day | ORAL | Status: DC
  Administered 2011-04-30 – 2011-05-02 (×3): 100 mg via ORAL
  Filled 2011-04-29 (×5): qty 1

## 2011-04-29 MED ORDER — CLONAZEPAM 0.5 MG PO TABS
0.5000 mg | ORAL_TABLET | Freq: Three times a day (TID) | ORAL | Status: DC
  Administered 2011-04-29 – 2011-05-02 (×9): 0.5 mg via ORAL
  Filled 2011-04-29 (×9): qty 1

## 2011-04-29 MED ORDER — CHLORDIAZEPOXIDE HCL 25 MG PO CAPS
25.0000 mg | ORAL_CAPSULE | Freq: Once | ORAL | Status: AC
  Administered 2011-04-29: 25 mg via ORAL
  Filled 2011-04-29: qty 1

## 2011-04-29 MED ORDER — ONDANSETRON 4 MG PO TBDP: 4.0000 mg | ORAL_TABLET | Freq: Four times a day (QID) | ORAL | Status: AC | PRN

## 2011-04-29 NOTE — Progress Notes (Signed)
[facility] Adult Inpatient Family/Significant Other Suicide Prevention Education  Suicide Prevention Education:  Education Completed; Marina Gravel,  (brother) [phone] has been identified by the patient as the family member/significant other with whom the patient will be residing, and identified as the person(s) who will aid the patient in the event of a mental health crisis (suicidal ideations/suicide attempt).  With written consent from the patient, the family member/significant other has been provided the following suicide prevention education, prior to the and/or following the discharge of the patient.  The suicide prevention education provided includes the following:  Suicide risk factors  Suicide prevention and interventions  National Suicide Hotline telephone number  [facility] assessment telephone number  [facility] Emergency Assistance 911  [facility] and/or Residential Mobile Crisis Unit telephone number  Request made of family/significant other to:  Remove weapons (e.g., guns, rifles, knives), all items previously/currently identified as safety concern.    Remove drugs/medications (over-the-counter, prescriptions, illicit drugs), all items previously/currently identified as a safety concern.  The family member/significant other verbalizes understanding of the suicide prevention education information provided.  The family member/significant other agrees to remove the items of safety concern listed above.  Contact attempt was made with [facility], LCAS Collins, Rayni 04/29/2011, 5:50 PM

## 2011-04-29 NOTE — Progress Notes (Addendum)
Patient ID: Brenda Allison, female   DOB: [birth year], 45 y.o.   MRN: [id number]  S:  Met with Carlisha one on one today.  She had a seizure yesterday in the group room and was seen in the emergency room and received IV fluids and supplemental potassium.  Reports she originally went to the ED because she was afraid she had had a seizure after running out of her meds within the past week which included 2mg  Alprazolam four times a day, which she had taken for more than a year.    She feels her memory and thinking are still foggy today and attributes this to the seizure. C/o feeling like her heart is going to jump out of her chest at times.    She has a history of seizures and was evaluated by a neurologist and [facility] and was prescibed Topamax 100mg  qam and 200mg  qhs for the seizures, but never received any definite diagnosis. She filled her Topamax at [facility] in [place].   The alprazolam was prescribed by her PCP.  She was also taking pain medications and has a history of several back surgeries.  Recently she has had persistent diarrhea for 2-3 weeks and has not been told a cause.  No evidence of previous screen for C.Diff, so we discussed doing that today.    She originally presented in the ED where she expressed some suicidal thoughts.  She admits to being depressed but denies suicidal thoughts at this time.  She has been depressed following the death of her husband by suicide, in Sept of 2012.    FH: Mother committed suicide  O: Pleasant, disheveled, cooperative. Difficulty with remote and recent memory - difficulty with med recall, recall of names of MD's for example.  BP and pulse elevated this morning. No dangerous thoughts. Fully awake but struggles to remember details of medical history.    A. Seizure Disorder.    Benzodiazepine dependence.  Denies history of substance abuse  Major Depression Axis NW:GNFAO and Loss Issues.    P: D/C Tegretol  LIbrium Detox  Protocol  Monitor closely  Continue Topamax.   No opiates at this time.   Check stool for c.diff, and Bmet panel.   Reviewed with Dr. Allena Katz who agrees with the plan.   1630. After discussion with Dr. Allena Katz, I will start her on Clonazepam .5mg  tid.

## 2011-04-29 NOTE — Progress Notes (Addendum)
Patient ID: Brenda Allison, female   DOB: 1965-06-25, 45 y.o.   MRN: [id number]/15/2012 Brenda Allison says that she feels very bad today. Her affect is flat. It is difficult to understand her because she has no teeth in her mouth. She says today is an " awful" day and that she does not want to get OOB and she asks that her lunch be bought back to her and this is done.She is given Librium for the first time today  And she is asked to get a stool speciment ( to rule out c diff). R Safety is maintaiend and POC includes maintaining therapeuitc relationship already established PD RN [facility]   Addendum @ 1845 D Pt has spent most of her day sleepng in her bed. She is tearful, states she feels "Awful" and that she has had watery dairrhea " ALt" today. She has been given immodium x 2 today ( no complaints of diarrhea voiced after the 2nd dose was given) and stool culture sent to the lab to rule out c difficile per MD order . Am Libirum DC 'd and Klonopin stared instead. R Safety is maintaiend and POC includes maintaining pt's fluid status and diarrhea moniotred and therapeutic relationship fostered. PD RN [facility]

## 2011-04-29 NOTE — Progress Notes (Signed)
Pt requested to see writer, pt c/o diarrehea  And was given a prn of imodium. Pt reports that she has been going to the bathroom off and on all day. Pt was given imodium and Clinical research associate informed pt that she would return later to inform her of her scheduled medications after checking orders. Pt denied having pain currently, -si/hi/a/v hall. Safety maintained on unit will continue to monitor.

## 2011-04-29 NOTE — Progress Notes (Signed)
Awake at frequent intervals.  "A lot on my mind."  Brief 1:1 contact.  No further seizure activity.  Instructed to discuss insomnia with doctor in morning.

## 2011-04-30 LAB — BASIC METABOLIC PANEL
BUN: 13 mg/dL (ref 6–23)
CO2: 22 mEq/L (ref 19–32)
Calcium: 8.9 mg/dL (ref 8.4–10.5)
Creatinine, Ser: 0.73 mg/dL (ref 0.50–1.10)
Glucose, Bld: 87 mg/dL (ref 70–99)
Sodium: 143 mEq/L (ref 135–145)

## 2011-04-30 MED ORDER — NICOTINE 21 MG/24HR TD PT24
21.0000 mg | MEDICATED_PATCH | Freq: Every day | TRANSDERMAL | Status: DC
  Administered 2011-04-30 – 2011-05-02 (×2): 21 mg via TRANSDERMAL
  Filled 2011-04-30 (×5): qty 1

## 2011-04-30 MED ORDER — NICOTINE 21 MG/24HR TD PT24
MEDICATED_PATCH | TRANSDERMAL | Status: AC
  Administered 2011-04-30: 21 mg via TRANSDERMAL
  Filled 2011-04-30: qty 1

## 2011-04-30 MED ORDER — POTASSIUM CHLORIDE CRYS ER 20 MEQ PO TBCR
40.0000 meq | EXTENDED_RELEASE_TABLET | Freq: Every day | ORAL | Status: DC
  Administered 2011-04-30 – 2011-05-02 (×3): 40 meq via ORAL
  Filled 2011-04-30 (×5): qty 2

## 2011-04-30 NOTE — Progress Notes (Signed)
[facility] Group Notes:  (Counselor/Nursing/MHT/Case Management/Adjunct)  04/30/2011 2:07 PM  Type of Therapy:  group therapy  Participation Level:  Active  Participation Quality:  Appropriate, Attentive and Redirectable  Affect:  Flat  Cognitive:  Oriented  Insight:  Good  Engagement in Group:  Good  Engagement in Therapy:  Good  Modes of Intervention:  Problem-solving, Support and exploration  Summary of Progress/Problems: Pt shared that support for her means having someone listen and being there in hard times, pt stated she has her son and brother for supports. Pt shared to give herself support she "will open the blinds daily and let the light in" as well as work on planning summer vacation to the beach.   Purcell Nails 04/30/2011, 2:07 PM

## 2011-04-30 NOTE — Progress Notes (Signed)
Patient ID: Brenda Allison, female   DOB: Oct 14, 1965, 45 y.o.   MRN: [id number] 04/30/2011  D  [name] is seen out in the hall first thing this morning...taking her meds at 0730, she shares that she still feels " Awful", that last night was " rough" and that she isn't sure she's going to have more diarrhea today. She is compliant with her meds. She is calm and cooperative when being dealt with . She requested more oj and was given some by this nurse. She remains sad, depressed and flat.  A She says that she wants " bad " to feel better today. R Safety is maintained  and POC includes monitoring pt's I & O, monitoring pt's diarrhea and medicating her if necessary and maintaining pt's safety. Pd rn bc

## 2011-04-30 NOTE — Progress Notes (Signed)
Patient ID: Brenda Allison, female   DOB: Apr 08, 1966, 45 y.o.   MRN: [id number]  S:  Feeling much better today.  Did not sleep too well last night due to q 15" checks by the techs.  No more episodes of feeling anxious, or like her heart is racing.  No more episodes of anxiety.  Her memory has improved over yesterday and she feels like she can fully participate in group.  She is up and about today, though still disheveled and in a gown.  We discussed showering and getting dressed.    She has no suicidal thoughts in the past 24 hours.  Rates depression, anxiety and hopelessness all a 3/10 on scale of 1-10 if 10 is the worst symptoms.  We ordered a stool for c.diff yesterday, but due to processing issues with the lab, we had to reorder it today.  She remains in a single room until we can make sure that her chronic diarrhea is not c.diff.  She is up and participating in group therapy.   Very pleased with the group therapy session and feels it is very helpful.   O:  Bright affect with quick responses, and speech is spontaneous, fluent and of normal production.  No dangerous thoughts.  Has a sense of humor.  Appetite improved.    K+ 3.0 today, bmet otherwise normal.  Diarrhea persists.    A. Major Depression  P: Continue Klonopin. Continue current plan.    BMET, stool for cdiff, and Magnesium Level.  Start Kdur daily.

## 2011-04-30 NOTE — Progress Notes (Signed)
Pt came to medication window c/o not feeling well. Pt reported that her stomach felt uneasy but she had not had any loose stools like the previous night. Pt was given gingerale. Pt reported that her day had been much better than the prevous one and she had visitors today as well. Pt was informed of her scheduled medications and requested a visteril also. Pt appears sad and depressed. Currently denies si/hi/a/v hall. Safety maintained on unit, will continue to monitor.

## 2011-04-30 NOTE — Progress Notes (Signed)
Pt attended after care group, pt received suicide prevention information and showed understanding of who is at risk,warning signs, what to do and who to call. Pt shared that she feels she has been having seizures but feels that the doctors do not believe her. Pt shared being in the home and finding husband after he committed suicide in Sept. 2012. Pt's depression 6, anxiety 6- pt recently moved to [facility] and wants grief therapy.

## 2011-05-01 LAB — BASIC METABOLIC PANEL
BUN: 15 mg/dL (ref 6–23)
Creatinine, Ser: 0.82 mg/dL (ref 0.50–1.10)
Glucose, Bld: 97 mg/dL (ref 70–99)

## 2011-05-01 LAB — MAGNESIUM: Magnesium: 2.3 mg/dL (ref 1.5–2.5)

## 2011-05-01 MED ORDER — SERTRALINE HCL 100 MG PO TABS
200.0000 mg | ORAL_TABLET | Freq: Every day | ORAL | Status: DC
  Administered 2011-05-01 – 2011-05-02 (×2): 200 mg via ORAL
  Filled 2011-05-01 (×4): qty 2

## 2011-05-01 MED ORDER — SALINE SPRAY 0.65 % NA SOLN
1.0000 | Freq: Three times a day (TID) | NASAL | Status: DC
  Administered 2011-05-01 (×2): 1 via NASAL
  Filled 2011-05-01: qty 44

## 2011-05-01 MED ORDER — TRAZODONE HCL 100 MG PO TABS
100.0000 mg | ORAL_TABLET | Freq: Every evening | ORAL | Status: DC | PRN
  Administered 2011-05-01: 100 mg via ORAL
  Filled 2011-05-01: qty 1

## 2011-05-01 NOTE — Progress Notes (Signed)
Pt attended discharge planning group and actively participated.  Pt presents with calm mood and affect.  Pt states she's not sleeping well and hasn't been for awhile now.  Pt ranks depression at a 3 and anxiety at a 2 today. Pt denies SI.  Pt states that she has been making herself get up and shower and take care of herself, which is improvement for her.  SW will refer pt to [facility] for medication management and therapy, as this is close to pt's home.  Safety planning and suicide prevention discussed.     Reyes Ivan, LCSWA 05/01/2011  10:51 AM

## 2011-05-01 NOTE — Progress Notes (Signed)
Patient ID: Brenda Allison, female   DOB: 21-Oct-1965, 45 y.o.   MRN: [id number]   S:  Complains of not being able to fall asleep at night.  This has been a chronic problem for much of her life.  Her daytime mood is stable and she scores her depression a 1/10 if 10 is the worst symptoms.She denies any suicidal thoughts.    However she also admits to ongoing grief over death of her husband.  She plans to remain here in [place] and not return to her home in [place].  If necessary, she could always go to live at her Son's home in [place]. Husband shot himself unexpectedly when [place] left the bedroom to go to the bathroom.  She had no clues that he intended to harm himself in any way.    Her brother is here in [place] and that is her primary support.  We discussed that she will need referrals for psychiatric follow-up when she leaves, she will also need referral for PCP.  She has a history of chronic back pain and will need follow up for this.  Discussed resources at [facility] for counseling and bereavement services.    She was previously on Zoloft 200mg  daily which she was taking regularly right up until the day prior to admission and we discussed restarting this.   O:  Alert, cooperative, pleasant.  Denies dangerous ideas.  She is in full contact with reality.  Insight adequate.    A. MDD   P: Add Trazodone 100mg  HSprn  Restart Zoloft.   Insomnia has been a chronic problem most of her life and will likely continue to be so.  Will give Trazodone, but I am reluctant to pursue too much medication for this.  She feels very stable on the current dose of clonazepam and has had no more seizure activity.

## 2011-05-01 NOTE — Progress Notes (Signed)
Pt rates depression at a 2 and hopelessness at a 0. Pt attends groups and interacts well with peers and staff. Pt has good insight but is very needy and attention seeking. Pt complains of hanging diarrhea and was given medication. Pt was offered support and encouragement. Pt denies SI/HI. Pt is receptive to treatment and safety is maintained on unit.

## 2011-05-01 NOTE — Tx Team (Signed)
Interdisciplinary Treatment Plan Update (Adult)  Date:  05/01/2011  Time Reviewed:  10:26 AM   Progress in Treatment: Attending groups: Yes Participating in groups:  Yes Taking medication as prescribed: Yes Tolerating medication:  Yes Family/Significant othe contact made:  Yes - contact made with brother Patient understands diagnosis:  Yes Discussing patient identified problems/goals with staff:  Yes Medical problems stabilized or resolved:  Yes Denies suicidal/homicidal ideation: Yes Issues/concerns per patient self-inventory:  None identified Other: N/A  New problem(s) identified: None Identified  Reason for Continuation of Hospitalization: Medication stabilization  Interventions implemented related to continuation of hospitalization: mood stabilization, medication monitoring and adjustment, group therapy and psycho education, safety checks q 15 mins  Additional comments: N/A  Estimated length of stay: 1-2 days  Discharge Plan: SW will refer pt to [facility] for medication management and therapy.  Referral also to be made to Hospice for grief/loss counseling .  New goal(s): N/A  Review of initial/current patient goals per problem list:    1. Goal(s): Reduce depressive symptoms  Met: Yes Target date: by discharge  As evidenced by: Reducing depression from a 10 to a 3 as reported by pt. Pt ranks at a 3 today.  2. Goal (s): Reduce/Eliminate suicidal ideation  Met: Yes Target date: by discharge  As evidenced by: pt reporting no SI.  3. Goal(s): Reduce anxiety  Met: Yes Target date: by discharge  As evidenced by: Reduce anxiety from a 10 to a 3 as reported by pt. Pt ranks at a 2 today.     Attendees: Patient:     Family:     Physician:  Lynann Bologna, NP  05/01/2011  10:26 AM   Nursing:   Quintella Reichert, RN 05/01/2011 10:28 AM   Case Manager:  Reyes Ivan, LCSWA 05/01/2011  10:26 AM   Counselor:  Angus Palms, LCSW 05/01/2011  10:26 AM     Other:  Vanetta Mulders, LPCA 05/01/2011  10:26 AM   Other:     Other:     Other:      Scribe for Treatment Team:   Carmina Miller, 05/01/2011 , 10:26 AM

## 2011-05-01 NOTE — Progress Notes (Signed)
[facility] Group Notes: (Counselor/Nursing/MHT/Case Management/Adjunct)   Type of Therapy:  Group Therapy  Participation Level:  Active  Participation Quality:  Attentive, Appropriate, Sharing  Affect:  Blunted  Cognitive:  Appropriate  Insight: Limited  Engagement in Group: Good  Engagement in Therapy:  Good  Modes of Intervention:  Support and Exploration  Summary of Progress/Problems: Brenda Allison shared that she sees being alone and fear of whether she can make it on her own as her biggest obstacle to wellness. She expressed hope that being around family will help her navigate this, and identified that acceptance of her husband's death is a first step on her road to wellness.   [name] 05/01/2011  12:47 PM

## 2011-05-01 NOTE — Progress Notes (Signed)
[facility] Group Notes: (Counselor/Nursing/MHT/Case Management/Adjunct)   Type of Therapy:  Group Therapy  Participation Level:  Active  Participation Quality:  Attentive, Appropriate, Sharing  Affect:  Blunted  Cognitive:  Appropriate  Insight:  Good  Engagement in Group: Good  Engagement in Therapy:  Good  Modes of Intervention:  Support and Exploration and Education  Summary of Progress/Problems: Brenda Allison participated in breathing and muscle relaxation exercises. She processed her experience afterwards, stating that she has always thought these techniques silly and that she never understood how monks meditated, but this technique was eye-opening and very tranquil for her.  [name] 05/01/2011  3:47 PM

## 2011-05-01 NOTE — Progress Notes (Addendum)
Recreation Therapy Group Note  Date: 05/01/2011         Time: 1145      Group Topic/Focus: Patient invited to participate in animal assisted therapy. Pets as a coping skill and responsibility were discussed.   Participation Level: Active  Participation Quality: Appropriate and Attentive  Affect: Appropriate  Cognitive: Appropriate and Oriented   Additional Comments: None 

## 2011-05-02 LAB — CLOSTRIDIUM DIFFICILE BY PCR: Toxigenic C. Difficile by PCR: POSITIVE — AB

## 2011-05-02 MED ORDER — POTASSIUM CHLORIDE 10 MEQ PO TBCR: 20.0000 meq | EXTENDED_RELEASE_TABLET | Freq: Every day | ORAL | Status: AC

## 2011-05-02 MED ORDER — SERTRALINE HCL 100 MG PO TABS: 200.0000 mg | ORAL_TABLET | Freq: Every day | ORAL | Status: AC

## 2011-05-02 MED ORDER — METRONIDAZOLE 250 MG PO TABS: 250.0000 mg | ORAL_TABLET | Freq: Three times a day (TID) | ORAL | Status: AC

## 2011-05-02 MED ORDER — AMITRIPTYLINE HCL 25 MG PO TABS
25.0000 mg | ORAL_TABLET | Freq: Every day | ORAL | Status: DC
  Filled 2011-05-02: qty 1

## 2011-05-02 MED ORDER — CLONAZEPAM 0.5 MG PO TABS: 0.5000 mg | ORAL_TABLET | Freq: Three times a day (TID) | ORAL | Status: AC | PRN

## 2011-05-02 MED ORDER — LIDOCAINE 5 % EX PTCH: 1.0000 | MEDICATED_PATCH | CUTANEOUS | Status: DC

## 2011-05-02 MED ORDER — TRAZODONE HCL 100 MG PO TABS: 100.0000 mg | ORAL_TABLET | Freq: Every evening | ORAL | Status: AC | PRN

## 2011-05-02 MED ORDER — AMITRIPTYLINE HCL 25 MG PO TABS: 25.0000 mg | ORAL_TABLET | Freq: Every day | ORAL | Status: AC

## 2011-05-02 NOTE — Discharge Summary (Signed)
Pt attended discharge planning group and actively participated.  Pt presents with calm mood and affect.  Pt reports feeling much better since her admission.  Pt ranks depression and anxiety at a 0 today.  Pt denies SI.  Pt reports feeling stable to d/c when dr. Bluford Main it appropriate.  Pt states she slept much better last night and reports this is from the medication she was started on.  Pt states she laughed last night for the first time with her peers and enjoyed this.  Pt verbalized d/c plan to stay on her meds and take them as prescribed, keep her follow up appointments and surround herself with positive people.  Pt states she was isolating herself and plans to be around good friends and family.  Pt explained she has a lot of close friends and family and needs to use them as part of her safety plan.  Pt states she also has a habit of rescheduling her appointments multiple times.  Pt will follow up at [facility] for medication management and therapy.  Pt reports her brother to help her get to her appointments.  Pt to return to own home and has transportation.  Pt has access to meds.  No further d/c needs voiced.  Safety planning and suicide prevention discussed.     Reyes Ivan, LCSWA 05/02/2011  10:44 AM

## 2011-05-02 NOTE — Discharge Summary (Signed)
Patient ID: Brenda Allison MRN: [id number] DOB/AGE: 1965-09-26 45 y.o.  Admit date: 04/27/2011 Discharge date: 05/02/2011  Reason for Admission: 45 y.o. female who presents to the ED with symptoms of severe anxiety and depression. Patient reports witnessing her husband of 26 years committing suicide on 02/09/11. She states she woke up because there was a light on and then heard a gun shot. She went to see what happened and found her husband dead. She reports that she "can't get over it." Patient states that she has not been sleeping since that time, stating that she gets approximately 2 hours of sleep a night.  Patient reports several symptoms of anxiety, including racing thoughts, rapid heart beat, and tight chest.  Hospital Course:  Pt was admitted and in her interview in treatment team she has a grand mal seizure from stopping her Xanax 1 week prior.  She was placed on Tegretol, but this was stopped and she was placed back on Klonopin.  Her seizure disorder had been treated with Topamax and that was restarted.  Her depression had been treated with Zoloft and that was restarted.     She learned some coping strategies and agreed to follow up with counseling.  Discharge Diagnoses:  Active Problems:  Benzodiazepine abuse   Condition on  Discharge: Patient denies suicidal or homicidal ideation, hallucinations, illusions, or delusions. Patient engages with good eye contact, is able to focus adequately in a one to one setting, and has clear goal directed thoughts. Patient speaks with a natural conversational volume, rate, and tone. Anxiety was reported at 1 on a scale of 1 the least and 10 the most. Depression was reported also at 1 on the same scale. Patient is oriented times 4, recent and remote memory intact. Judgement: Intact Insight: improved from admission   Current Discharge Medication List    START taking these medications   Details  amitriptyline (ELAVIL) 25 MG tablet Take 1  tablet (25 mg total) by mouth at bedtime. Qty: 30 tablet, Refills: 0    metroNIDAZOLE (FLAGYL) 250 MG tablet Take 1 tablet (250 mg total) by mouth 3 (three) times daily. Qty: 30 tablet, Refills: 0    traZODone (DESYREL) 100 MG tablet Take 1 tablet (100 mg total) by mouth at bedtime as needed for sleep. Qty: 30 tablet, Refills: 0      CONTINUE these medications which have CHANGED   Details  potassium chloride (KLOR-CON) 10 MEQ CR tablet Take 2 tablets (20 mEq total) by mouth daily.    sertraline (ZOLOFT) 100 MG tablet Take 2 tablets (200 mg total) by mouth daily. Qty: 60 tablet, Refills: 0      CONTINUE these medications which have NOT CHANGED   Details  albuterol (PROVENTIL HFA;VENTOLIN HFA) 108 (90 BASE) MCG/ACT inhaler Inhale 2 puffs into the lungs every 4 (four) hours as needed. As needed for wheezing.     cetirizine (ZYRTEC) 10 MG tablet Take 10 mg by mouth daily.      desoximetasone (TOPICORT) 0.05 % cream Apply 1 application topically 2 (two) times daily as needed. Apply to toes as needed for irritation.     levothyroxine (SYNTHROID, LEVOTHROID) 200 MCG tablet Take 200 mcg by mouth daily.      liothyronine (CYTOMEL) 25 MCG tablet Take 25 mcg by mouth daily.      topiramate (TOPAMAX) 100 MG tablet Take 100-200 mg by mouth 2 (two) times daily. Take one tablet in the morning and two tablets every night at bedtime.  STOP taking these medications     ALPRAZolam (XANAX) 1 MG tablet      carisoprodol (SOMA) 350 MG tablet      morphine (MS CONTIN) 60 MG 12 hr tablet      oxyCODONE (ROXICODONE) 15 MG immediate release tablet      QUEtiapine (SEROQUEL) 50 MG tablet        Follow-up Information    Follow up with [facility] on 05/29/2011. (Appointment scheduled at 1:00 pm)    Contact information:   [address] [place], [place] [address] [phone]      Call Hospice for grief/loss counseling. (Call to schedule an appointment)    Contact  information:   [address] [place], [place] [address] [phone]       SignedOrson Aloe 05/02/2011, 2:50 PM

## 2011-05-02 NOTE — Progress Notes (Signed)
[facility] Group Notes: (Counselor/Nursing/MHT/Case Management/Adjunct)   Type of Therapy:  Group Therapy  Participation Level:  Active  Participation Quality:  Attentive, Appropriate, Sharing  Affect:  Blunted  Cognitive:  Appropriate  Insight:  Good  Engagement in Group: Good  Engagement in Therapy:  Good  Modes of Intervention:  Support and Exploration  Summary of Progress/Problems: Brenda Allison shared that is angry when given negative labels, but that when she is labeled something positive (such as when her kids thought she was perfect) this puts her under a lot of pressure to maintain that label and still is not a positive experience. She explored the concept that one can succeed despite having a mental illness, and stated that this is often more impressive than those who succeed without having dealt with such an obstacle.  [name] 05/02/2011  2:58 PM

## 2011-05-02 NOTE — Progress Notes (Signed)
Patient ID: Brenda Allison, female   DOB: 08/20/1965, 45 y.o.   MRN: [id number] Pt slept "[name]" for the very first time last night. Mood is good Anxiety 0 as long as on the medications.

## 2011-05-02 NOTE — Progress Notes (Signed)
Patient ID: Brenda Allison, female   DOB: 1965/12/19, 45 y.o.   MRN: [id number]  Lab called this evening in regards to c.diff lab order. Order needed was c.diff by PCR. Called MD on call to receive this order. Order was placed. Patient's only complaint this evening was of poor sleep on previous nights. Explained to patient that a new order of trazodone was ordered for sleep. Patient took trazodone this evening along with scheduled medication and upon reassessment, patient was asleep. Patient polite and appropriate. Patient appreciative about explanation of new ordered sleep medication. Denies SI/HI/AV. Patient states that imodium taken earlier today has been helpful, and will alert nurse if diarrhea persists.

## 2011-05-02 NOTE — Tx Team (Signed)
Interdisciplinary Treatment Plan Update (Adult)  Date:  05/02/2011  Time Reviewed:  10:53 AM   Progress in Treatment: Attending groups: Yes Participating in groups:  Yes Taking medication as prescribed: Yes Tolerating medication:  Yes Family/Significant othe contact made:  Yes Patient understands diagnosis:  Yes Discussing patient identified problems/goals with staff:  Yes Medical problems stabilized or resolved:  Yes Denies suicidal/homicidal ideation: Yes Issues/concerns per patient self-inventory:  None identified Other: N/A  New problem(s) identified: None Identified  Reason for Continuation of Hospitalization: Stable to d/c  Interventions implemented related to continuation of hospitalization: Stable to d/c   Additional comments: N/A  Estimated length of stay: D/C today  Discharge Plan: Pt will follow up at [facility] for medication management and therapy.    New goal(s): N/A  Review of initial/current patient goals per problem list:    1.  Goal(s): Reduce depressive symptoms  Met:  yes  Target date: by discharge  As evidenced by: Reducing depression from a 10 to a 3 as reported by pt. Pt ranks depression at a 0 today  2.  Goal (s): Reduce/Eliminate suicidal ideation  Met:  yes  Target date: by discharge  As evidenced by: pt reporting no SI.  Pt denies SI.   3.  Goal(s): Reduce anxiety  Met:  Yes  Target date: by discharge  As evidenced by: Reduce anxiety from a 10 to a 3 as reported by pt. Pt ranks anxiety at a 0 today   Attendees: Patient:     Family:     Physician:  Orson Aloe, MD  05/02/2011  10:53 AM   Nursing:   Quintella Reichert, RN 05/02/2011 10:56 AM   Case Manager:  Reyes Ivan, LCSWA 05/02/2011  10:53 AM   Counselor:  Angus Palms, LCSW 05/02/2011  10:53 AM   Other:  Vanetta Mulders, LPCA 05/02/2011  10:53 AM   Other:  Serena Colonel, NP 05/02/2011 10:56 AM   Other:  Nanine Means, RN 05/02/2011 10:57 AM   Other:       Scribe for Treatment Team:   Carmina Miller, 05/02/2011 , 10:53 AM

## 2011-05-02 NOTE — Discharge Summary (Signed)
Discharge Note  Patient:  Brenda Allison is an 45 y.o., female DOB:  02/10/1966  Date of Admission:  04/27/2011  Date of Discharge:  05/02/2011  Level of Care:  OP  Discharge destination:  HOME  Is patient on multiple antipsychotic therapies at discharge:  NO  Patient phone:  [phone] (home) Patient address:   [address]  [place] [address]  The patient received suicide prevention pamphlet:  YES Belongings returned:  Valuables  Dan Humphreys, Jaeceon Michelin 05/02/2011,2:49 PM

## 2011-05-02 NOTE — Progress Notes (Signed)
Patient ID: Brenda Allison, female   DOB: June 18, 1965, 45 y.o.   MRN: [id number] Pt is cooperative but remains needy with multiple requests.  She reported her sleep as well and her appetite improving.  Her energy level is normal and her ability to pay attention is improving.  Her depression is a 2/10 and her hopelessness as a 1/10.  Her lab did return with a positive c-diff result, communicated to the MD and NP during treatment team this am, pt encouraged to continue to wash her hands.  Her goal is to be positive in every way.

## 2011-05-02 NOTE — Progress Notes (Signed)
Grief Loss Group  Group focused on losses primarily related to relationships. Discussion also occurred around the experience of grief feelings and reactions and how to cope in healthy ways. Pt. Talked about the suicide of her husband of 27 yrs. And how traumatic it has been for her. She stated that she has been overwhelmed and has returned to [place] to be near family. She was able to talk about her feelings and fears and deep sadness as she felt they had a strong marriage.   Brenda Allison.Div, BCC

## 2011-05-02 NOTE — Progress Notes (Signed)
Patient ID: Brenda Allison, female   DOB: 1965-07-24, 45 y.o.   MRN: [id number] Pt denies SI/HI/AVH.  [name] discharge instructions were given and explained with Rx and crisis numbers.  Her belongings were returned at discharge and she was escorted to her ride by the RN.

## 2011-05-02 NOTE — Progress Notes (Signed)
[facility] Group Notes: (Counselor/Nursing/MHT/Case Management/Adjunct)   Type of Therapy:  Group Therapy  Participation Level:  Active  Participation Quality:  Appropriate, Attentive, Sharing  Affect:  Appropriate  Cognitive:  Appropriate  Insight:  Good  Engagement in Group:  Good  Engagement in Therapy: Good  Modes of Intervention:  Support and Exploration  Summary of Progress/Problems:   Caasi processed her recognition that her husband made his choice to kill himself and she has made her choice to live. She shared the pain that is the fact that she will never be able to ask husband why he made this choice, and seemed somewhat receptive to the concept that even if she knew why, it would not make healing any easier. Cyrena explored recovery, saying that to her a big part of it is being able to deal with the things one does not want to deal with, which is why she talked about her husband's death in grief/loss group today, and why she plans to continue therapy for the loss.  [name] 05/02/2011  12:57 PM

## 2011-05-02 NOTE — Progress Notes (Signed)
Suicide Risk Assessment  Discharge Assessment     Demographic factors:  Assessment Details Time of Assessment: Admission Information Obtained From: Patient Current Mental Status:  Current Mental Status:  (Denies SI/HI) Risk Reduction Factors:  Risk Reduction Factors: Positive social support;Positive therapeutic relationship;Religious beliefs about death  CLINICAL FACTORS:   Severe Anxiety and/or Agitation Depression:   Hopelessness Epilepsy Previous Psychiatric Diagnoses and Treatments Medical Diagnoses and Treatments/Surgeries  COGNITIVE FEATURES THAT CONTRIBUTE TO RISK:  Thought constriction (tunnel vision)    SUICIDE RISK:   Minimal: No identifiable suicidal ideation.  Patients presenting with no risk factors but with morbid ruminations; may be classified as minimal risk based on the severity of the depressive symptoms  Patient denies suicidal or homicidal ideation, hallucinations, illusions, or delusions. Patient engages with good eye contact, is able to focus adequately in a one to one setting, and has clear goal directed thoughts. Patient speaks with a natural conversational volume, rate, and tone. Anxiety was reported at 1 on a scale of 1 the least and 10 the most. Depression was reported also at 1 on the same scale. Patient is oriented times 4, recent and remote memory intact. Judgement: Intact Insight: improved from admission Plan:  Remain ofn . amitriptyline  25 mg Oral QHS  . clonazePAM  0.5 mg Oral TID  . levothyroxine  200 mcg Oral Daily  . liothyronine  25 mcg Oral Daily  . loratadine  10 mg Oral Daily  . multivitamins ther. w/minerals  1 tablet Oral Daily  . nicotine  21 mg Transdermal Q0600  . potassium chloride  40 mEq Oral Daily  . sertraline  200 mg Oral Daily  . thiamine  100 mg Oral Daily  . topiramate  100 mg Oral Daily   And  . topiramate  200 mg Oral QHS  . triamcinolone   Topical BID  . DISCONTD: lidocaine  1 patch Transdermal Q24H  . DISCONTD:  sodium chloride  1 spray Each Nare TID  Because Elavil for help with depression, sleep, and pain management/ fibromyalgia, Klonopin for anxiety, could try to shift to Thorazine which is not addictive or cause memory loss, Thyroid replacements for low thyroid, Claritin for allergies, Multivitamins with minerals for nutritional balance, nicoderm for stopping smoking, Potassium for replacement, must follow-up with family doctor or internist, Zoloft for depression, Flagyl for C Diff/ diarrhea, Topamax for seizures. Pt voices understanding of the risks and benefits of medication. Follow-up is with Follow-up Information    Follow up with [facility] on 05/29/2011. (Appointment scheduled at 1:00 pm)    Contact information:   [address] [place], [place] [address] [phone]      Call Hospice for grief/loss counseling. (Call to schedule an appointment)    Contact information:   [address] [place], [place] [address] [phone]        Orson Aloe 05/02/2011, 2:41 PM

## 2011-05-04 NOTE — Progress Notes (Addendum)
Patient Discharge Instructions:  Discharge Note Faxed,   05/03/2011 After Visit Summary Faxed,  05/03/2011 Faxed to the Next [facility] Care provider:  05/03/2011 Facesheet faxed 05/03/2011  Faxed to [facility] @ [phone]  No consent for Hospice  Wandra Scot, 05/04/2011, 11:22 AM

## 2011-05-24 NOTE — H&P (Signed)
Identifying information: This is a 42 rolled Caucasian female married this is voluntary admission.  History of present illness:  Time he was admitted with a history of increasing depression and suffered a lot of grief and loss issues. Her husband died in 03-10-11 by suicide by gunshot wound. This was very unexpected and she had no idea that he been so dressed. She came to [place] to live [place] brother who is supportive. She had become increasingly depressed living in the house without her husband. She was previously treated by a neurologist for seizures and reports taking Topamax and Alprazolam 2mg  qid, for many years. She has not been able to fill any of her medications. She presents with a lot of hopelessness and passive suicidal thoughts.   Past psychiatric history:  No current outpatient treatment. She originally came to the emergency room because she feared she had already had a seizure prior to arrival, then revealed that she was dealing with a lot of loss and depression.   Social History: Widowed caucasian female with no legal problems, and financially stable.  Living in her own apt with support of her brother.  Denies a history of substance abuse.   Medical Evaluation:  Time he has had a seizure on Friday morning the 14th while in team meeting and was transferred to the emergency room for further evaluation. I've medically and physically evaluated her here and she has had a generalized tonic-clonic seizure so emergency medication she will be transferred for further evaluation.  Mental status exam:  Fully alert female cooperative with logical thinking and denying active suicidal thoughts, fund of knowledge is adequate, insight adequate intelligence average. Able to give a logical history in asking for help with her depression. Also asking to be stabilized back on medications to avoid seizure. Thinking is nonpsychotic insight and judgment normal.  Admitting diagnosis: Maj.  depression recurrent severe,Benzodiazepine Dependence.  Axis II: No diagnosis.   Axis III: Seizure disorder Axis IV: Grief and loss issues. Axis V: Current 40 past year not known.  P:  LIbrium Detox Protocol  Monitor closely  Continue Topamax.  No opiates at this time.  Check stool for c.diff, and Bmet panel.

## 2013-07-05 IMAGING — CR DG CHEST 2V
2 series · 2 of 2 positions shown · non-contrast
Comparison: None.

CLINICAL DATA: Cough.  Fever.  Diarrhea.  Vomiting.

CHEST - 2 VIEW

[w chest pa]
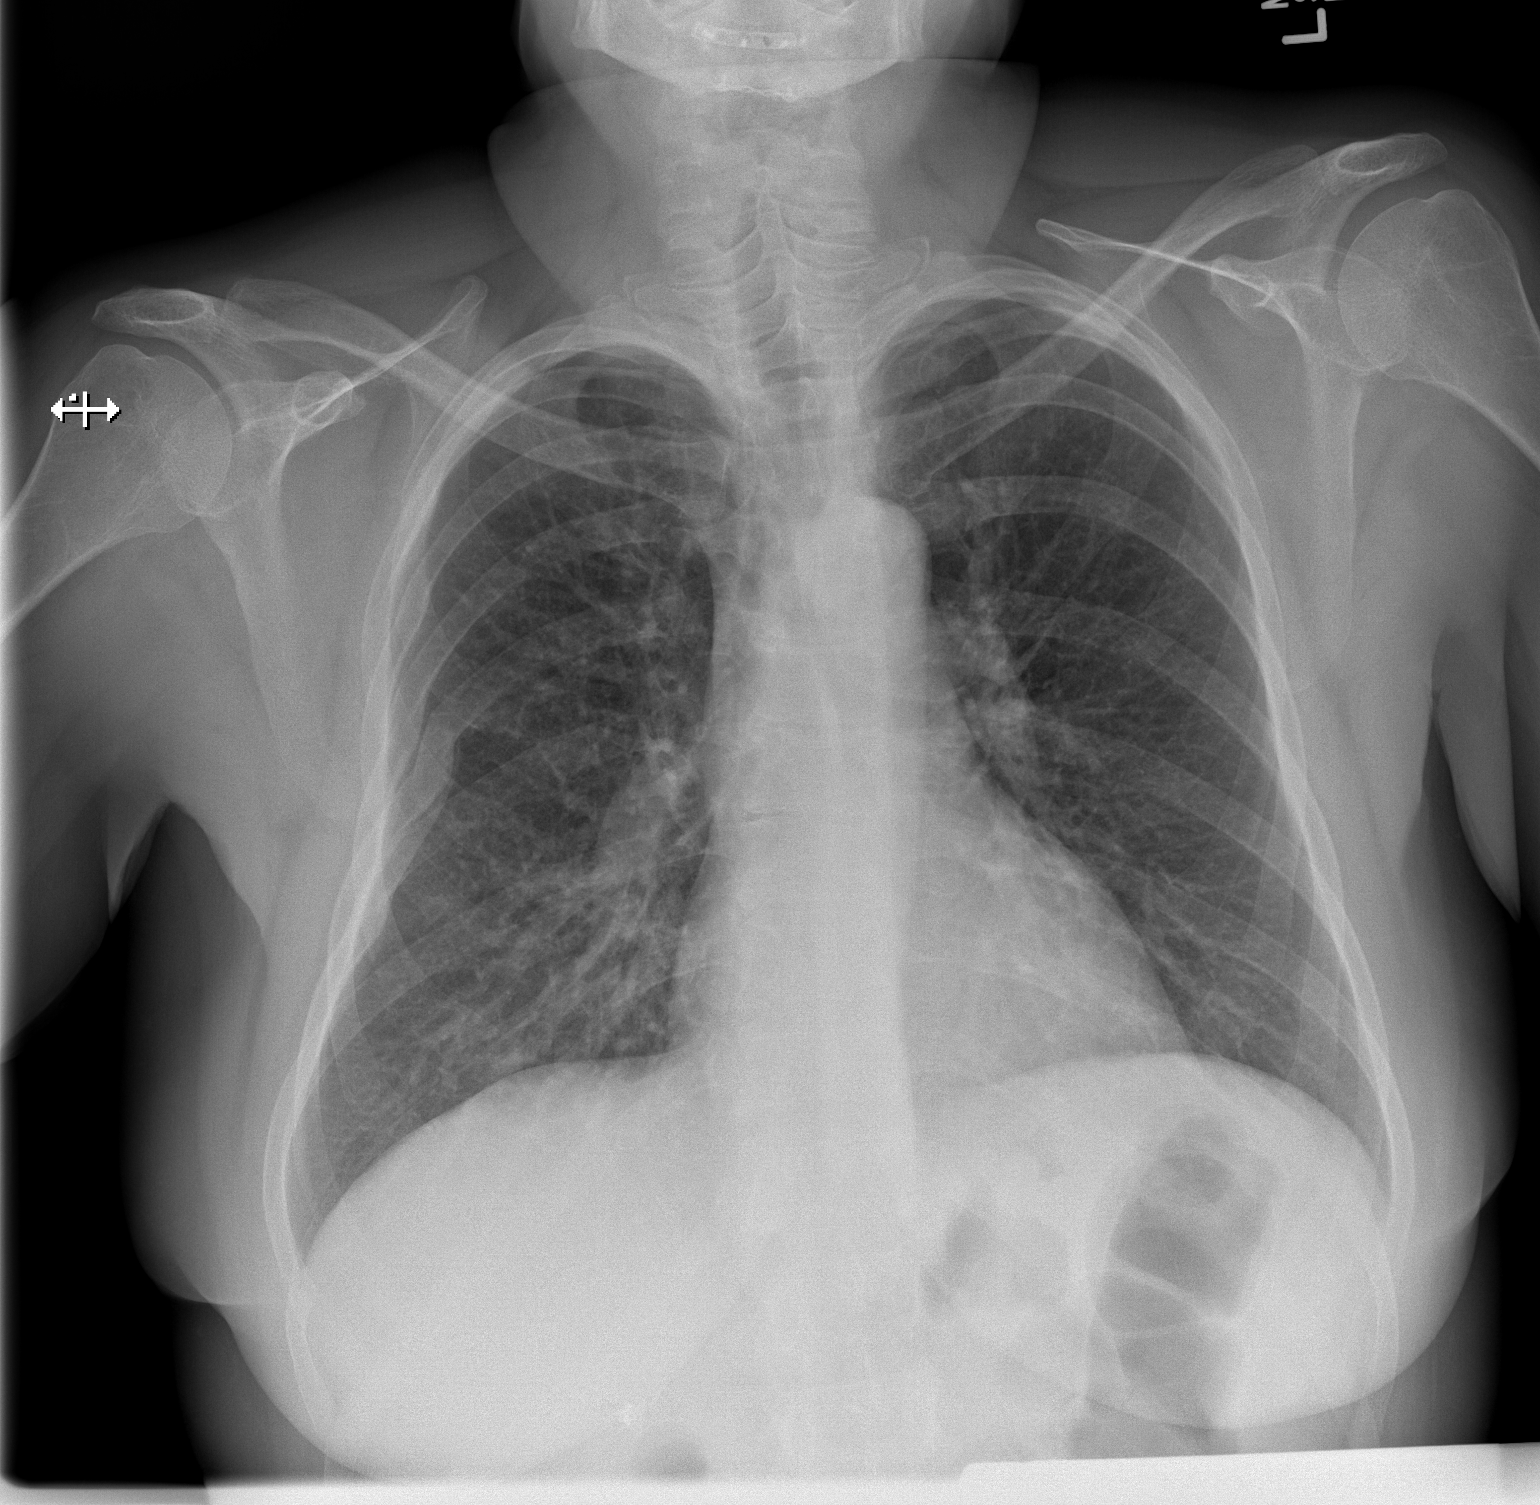

[w chest lat]
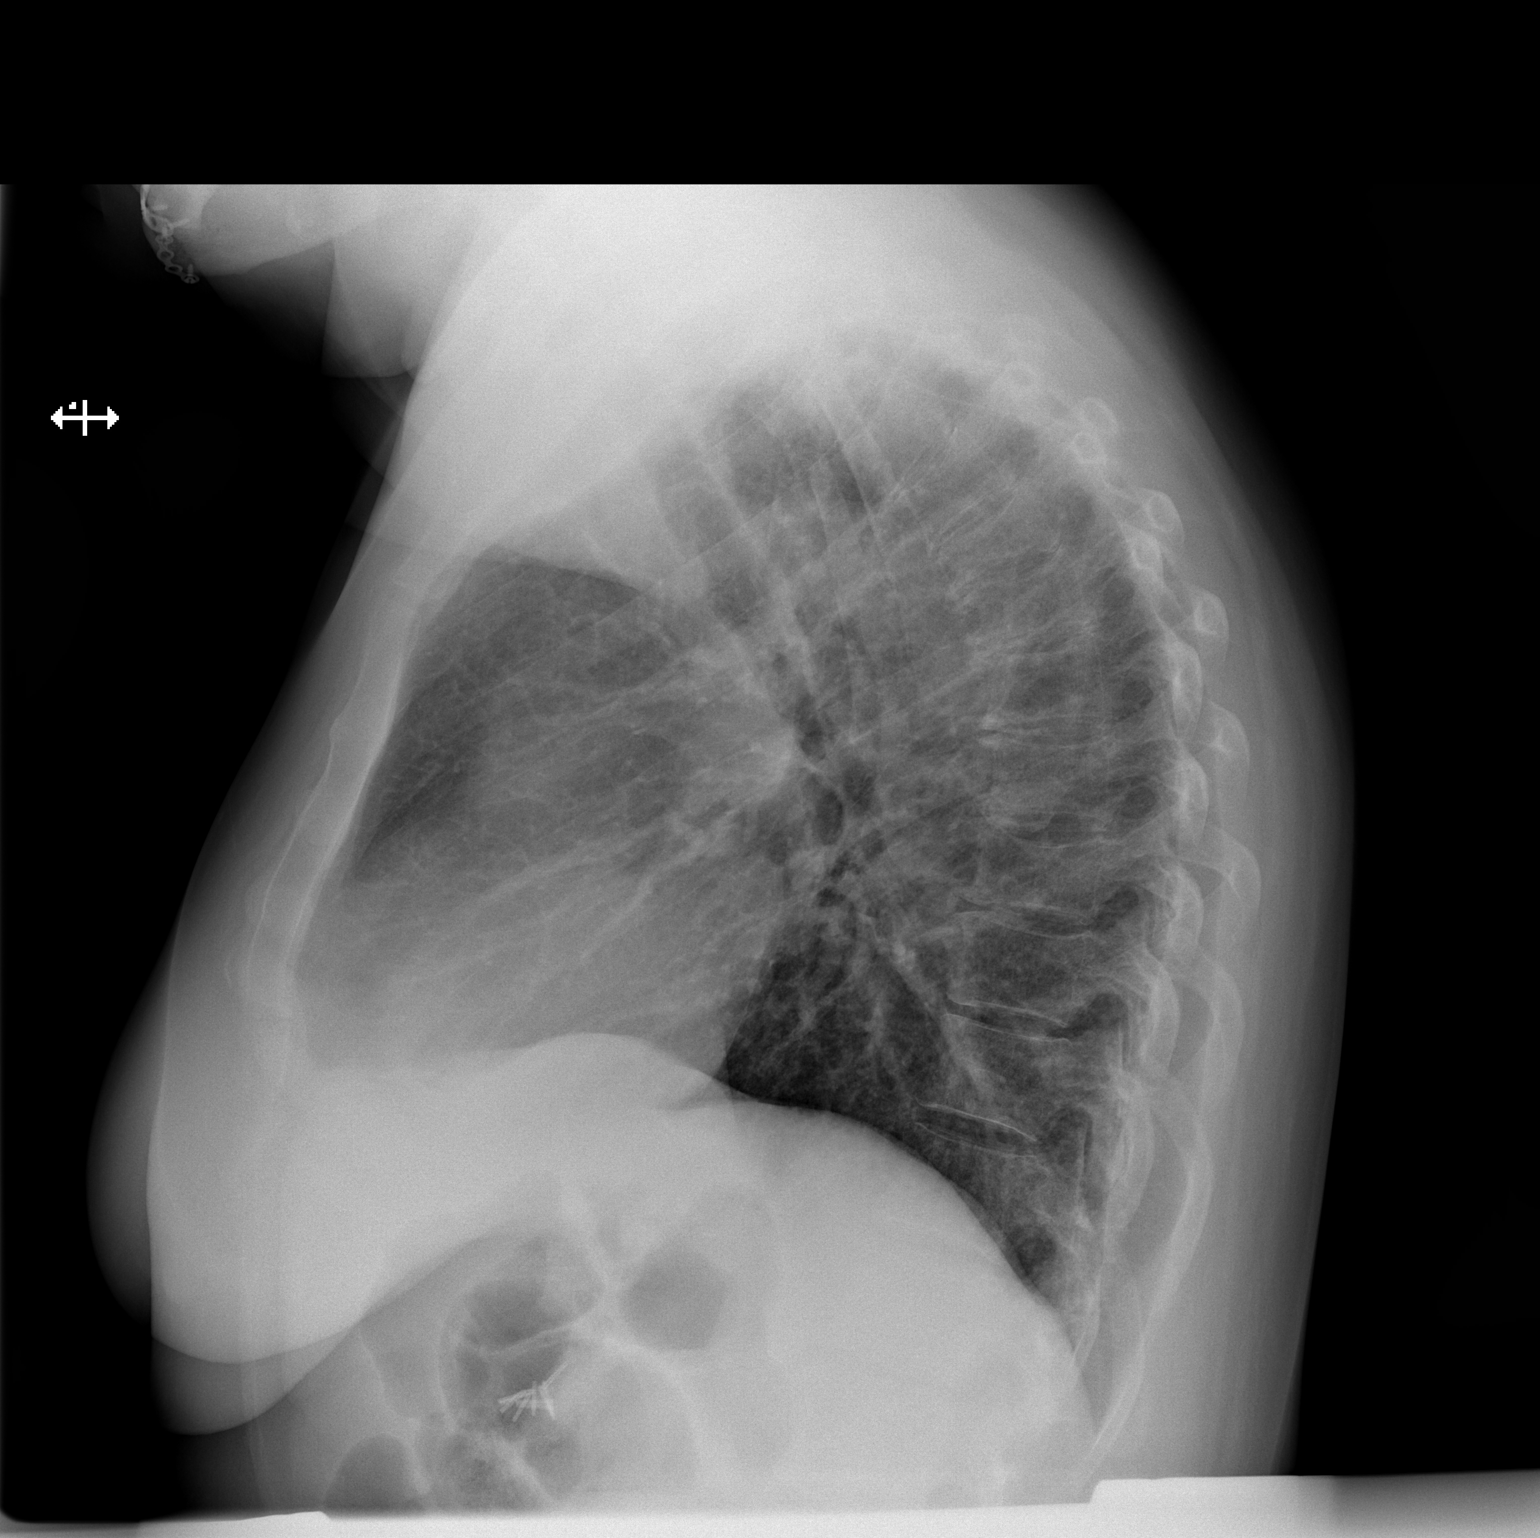

[2 of 2 positions shown; findings below may reference images not displayed]

FINDINGS: Heart size is normal.  Both lungs are clear.  No evidence
of pleural effusion.  No mass or lymphadenopathy identified.

Multiple old right lateral rib fracture deformities are seen.
IMPRESSION: No active cardiopulmonary disease.

## 2013-10-22 ENCOUNTER — Encounter (HOSPITAL_COMMUNITY): Payer: Self-pay | Admitting: Emergency Medicine

## 2013-10-22 ENCOUNTER — Emergency Department (HOSPITAL_COMMUNITY)
Admission: EM | Admit: 2013-10-22 | Discharge: 2013-10-22 | Disposition: A | Discharge status: Home / Self Care | Payer: BC Managed Care – PPO | Attending: Emergency Medicine | Admitting: Emergency Medicine

## 2013-10-22 DIAGNOSIS — M549 Dorsalgia, unspecified: Secondary | ICD-10-CM | POA: Insufficient documentation

## 2013-10-22 DIAGNOSIS — F329 Major depressive disorder, single episode, unspecified: Secondary | ICD-10-CM | POA: Insufficient documentation

## 2013-10-22 DIAGNOSIS — I10 Essential (primary) hypertension: Secondary | ICD-10-CM | POA: Insufficient documentation

## 2013-10-22 DIAGNOSIS — Z79899 Other long term (current) drug therapy: Secondary | ICD-10-CM | POA: Insufficient documentation

## 2013-10-22 DIAGNOSIS — F411 Generalized anxiety disorder: Secondary | ICD-10-CM | POA: Insufficient documentation

## 2013-10-22 DIAGNOSIS — F3289 Other specified depressive episodes: Secondary | ICD-10-CM | POA: Insufficient documentation

## 2013-10-22 DIAGNOSIS — F17200 Nicotine dependence, unspecified, uncomplicated: Secondary | ICD-10-CM | POA: Insufficient documentation

## 2013-10-22 DIAGNOSIS — E039 Hypothyroidism, unspecified: Secondary | ICD-10-CM | POA: Insufficient documentation

## 2013-10-22 DIAGNOSIS — G8929 Other chronic pain: Secondary | ICD-10-CM | POA: Insufficient documentation

## 2013-10-22 DIAGNOSIS — Z760 Encounter for issue of repeat prescription: Secondary | ICD-10-CM | POA: Insufficient documentation

## 2013-10-22 MED ORDER — ALPRAZOLAM 1 MG PO TABS: 1.0000 mg | ORAL_TABLET | Freq: Three times a day (TID) | ORAL | Status: AC | PRN

## 2013-10-22 MED ORDER — ALPRAZOLAM 0.5 MG PO TABS
1.0000 mg | ORAL_TABLET | Freq: Once | ORAL | Status: AC
  Administered 2013-10-22: 1 mg via ORAL
  Filled 2013-10-22: qty 2

## 2013-10-22 NOTE — ED Notes (Signed)
Pt has PTSD and takes xanax 4x/ day. Pt missed her scheduled appointment. Thought she could go without xanax and wait till next appointment on 6/16. Pt now going through withdrawal from xanax.

## 2013-10-22 NOTE — ED Notes (Signed)
Pt doctor is not able to fill prescription for xanax until the 16 due to long time since pt has not seen pt. Pt has not had xanax in 3 days. Pt states she is beginning to have symptoms since not taking xanax, seeing spots in vision, change in BP, headache.

## 2013-10-22 NOTE — Discharge Instructions (Signed)
Follow up with your doctor. Have your blood pressure rechecked. Return to the ED if your headache or vision changes worsen.  Generalized Anxiety Disorder Generalized anxiety disorder (GAD) is a mental disorder. It interferes with life functions, including relationships, work, and school. GAD is different from normal anxiety, which everyone experiences at some point in their lives in response to specific life events and activities. Normal anxiety actually helps [place] prepare for and get through these life events and activities. Normal anxiety goes away after the event or activity is over.  GAD causes anxiety that is not necessarily related to specific events or activities. It also causes excess anxiety in proportion to specific events or activities. The anxiety associated with GAD is also difficult to control. GAD can vary from mild to severe. People with severe GAD can have intense waves of anxiety with physical symptoms (panic attacks).  SYMPTOMS The anxiety and worry associated with GAD are difficult to control. This anxiety and worry are related to many life events and activities and also occur more days than not for 6 months or longer. People with GAD also have three or more of the following symptoms (one or more in children):  Restlessness.   Fatigue.  Difficulty concentrating.   Irritability.  Muscle tension.  Difficulty sleeping or unsatisfying sleep. DIAGNOSIS GAD is diagnosed through an assessment by your caregiver. Your caregiver will ask you questions aboutyour mood,physical symptoms, and events in your life. Your caregiver may ask you about your medical history and use of alcohol or drugs, including prescription medications. Your caregiver may also do a physical exam and blood tests. Certain medical conditions and the use of certain substances can cause symptoms similar to those associated with GAD. Your caregiver may refer you to a mental health specialist for further  evaluation. TREATMENT The following therapies are usually used to treat GAD:   Medication Antidepressant medication usually is prescribed for long-term daily control. Antianxiety medications may be added in severe cases, especially when panic attacks occur.   Talk therapy (psychotherapy) Certain types of talk therapy can be helpful in treating GAD by providing support, education, and guidance. A form of talk therapy called cognitive behavioral therapy can teach you healthy ways to think about and react to daily life events and activities.  Stress managementtechniques These include yoga, meditation, and exercise and can be very helpful when they are practiced regularly. A mental health specialist can help determine which treatment is best for you. Some people see improvement with one therapy. However, other people require a combination of therapies. Document Released: 08/26/2012 Document Reviewed: 08/26/2012 [facility] Patient Information 2014 [place], [place].

## 2013-10-22 NOTE — ED Provider Notes (Signed)
CSN: [id number]     Arrival date & time 10/22/13  1833 History  This chart was scribed for Antony Madura, PA working with Juliet Rude. Rubin Payor, MD by Chestine Spore, ED Scribe. The patient was seen in room WTR4/WLPT4 at 8:48 PM.   Chief Complaint  Patient presents with  . Medication Refill    The history is provided by the patient and a relative. No language interpreter was used.   HPI Comments: Brenda Allison is a 48 y.o. female who presents to the Emergency Department for of a medication refill. Pt states that her doctor is not able to fill prescription for Xanax until the 16th as she missed the previous appointment. Pt has not had xanax in 4 days. Pt states she is beginning to have symptoms since not taking xanax, including seeing spots in vision, feeling like her blood pressure is high, and headache. Currently she has a headache but she denies visual changes. Pt states that she has associated symptoms of diarrhea and changes in her appetite. She denies vision loss, syncope, CP, SOB, and vomiting. She states that she is currently being treated for PTSD since witnessing her husband's suicide.   Past Medical History  Diagnosis Date  . Depression   . Anxiety   . Chronic back pain   . Hypertension   . Hypothyroidism    Past Surgical History  Procedure Laterality Date  . Cholecystectomy    . Back surgery    . Tonsillectomy    . Appendectomy    . Abdominal hysterectomy     No family history on file. History  Substance Use Topics  . Smoking status: Current Every Day Smoker  . Smokeless tobacco: Never Used  . Alcohol Use: Yes   OB History   Grav Para Term Preterm Abortions TAB SAB Ect Mult Living                  Review of Systems  Constitutional: Positive for appetite change. Negative for fever.  Eyes: Positive for visual disturbance.  Respiratory: Negative for shortness of breath.   Cardiovascular: Positive for chest pain.  Gastrointestinal: Positive for diarrhea. Negative for  vomiting.  Neurological: Positive for headaches. Negative for syncope.  All other systems reviewed and are negative.   Allergies  Buspar  Home Medications   Prior to Admission medications   Medication Sig Start Date End Date Taking? Authorizing Provider  albuterol (PROVENTIL HFA;VENTOLIN HFA) 108 (90 BASE) MCG/ACT inhaler Inhale 2 puffs into the lungs every 4 (four) hours as needed. As needed for wheezing.     Historical Provider, MD  ALPRAZolam Prudy Feeler) 1 MG tablet Take 1 tablet (1 mg total) by mouth 3 (three) times daily as needed for anxiety. 10/22/13   Antony Madura, PA-C  amitriptyline (ELAVIL) 25 MG tablet Take 1 tablet (25 mg total) by mouth at bedtime. 05/02/11 05/01/12  Mike Craze, MD  cetirizine (ZYRTEC) 10 MG tablet Take 10 mg by mouth daily.      Historical Provider, MD  desoximetasone (TOPICORT) 0.05 % cream Apply 1 application topically 2 (two) times daily as needed. Apply to toes as needed for irritation.     Historical Provider, MD  levothyroxine (SYNTHROID, LEVOTHROID) 200 MCG tablet Take 200 mcg by mouth daily.      Historical Provider, MD  liothyronine (CYTOMEL) 25 MCG tablet Take 25 mcg by mouth daily.      Historical Provider, MD  potassium chloride (KLOR-CON) 10 MEQ CR tablet Take 2 tablets (20 mEq  total) by mouth daily. 05/02/11   Sanjuana KavaAgnes I Nwoko, NP  sertraline (ZOLOFT) 100 MG tablet Take 2 tablets (200 mg total) by mouth daily. 05/02/11 05/01/12  Sanjuana KavaAgnes I Nwoko, NP  topiramate (TOPAMAX) 100 MG tablet Take 100-200 mg by mouth 2 (two) times daily. Take one tablet in the morning and two tablets every night at bedtime.     Historical Provider, MD   BP 143/122  Pulse 91  Temp(Src) 98 F (36.7 C) (Oral)  Resp 22  SpO2 97%  Physical Exam  Nursing note and vitals reviewed. Constitutional: She is oriented to person, place, and time. She appears well-developed and well-nourished. No distress.  Nontoxic/nonseptic appearing  HENT:  Head: Normocephalic and atraumatic.   Mouth/Throat: Oropharynx is clear and moist. No oropharyngeal exudate.  Eyes: Conjunctivae and EOM are normal. No scleral icterus.  Neck: Normal range of motion.  Cardiovascular: Normal rate, regular rhythm and normal heart sounds.   Pulmonary/Chest: Effort normal and breath sounds normal. No respiratory distress. She has no wheezes. She has no rales.  Chest expansion symmetric  Musculoskeletal: Normal range of motion.  Neurological: She is alert and oriented to person, place, and time.  GCS 15. Speech is goal oriented. No focal neurologic deficits appreciated. Patient moves extremities without ataxia. She ambulates with normal gait.  Skin: Skin is warm and dry. No rash noted. She is not diaphoretic. No erythema. No pallor.  Psychiatric: Her speech is normal. Her mood appears anxious. She is withdrawn. She expresses no homicidal and no suicidal ideation. She expresses no suicidal plans and no homicidal plans.    ED Course  Procedures (including critical care time) DIAGNOSTIC STUDIES: Oxygen Saturation is 97% on room air,  normal by my interpretation.    COORDINATION OF CARE: 9:01 PM-Discussed treatment plan which includes a short refill until she can see her psychiatrist with pt at bedside and pt agreed to plan.    Labs Review Labs Reviewed - No data to display  Imaging Review No results found.   EKG Interpretation None      MDM   Final diagnoses:  Medication refill  Hypertension    Uncomplicated medication refill. Patient states she has been unable to take her Xanax due to missing her appointment. Patient is scheduled to followup with her doctor in 6 days. Patient has been having withdrawal symptoms of intermittent spots in her vision, sensation that her blood pressure is high, and headaches as a result of not taking her Xanax. Patient today is well and nontoxic appearing with a nonfocal neurologic exam. Patient is hemodynamically stable. Will refill Xanax for short course,  but have explained to patient that the emergency department is not be appropriate venue for management of chronic conditions. Patient verbalizes understanding his affect. She is stable for discharge with return precautions. Patient agreeable to plan with no unaddressed concerns.  I personally performed the services described in this documentation, which was scribed in my presence. The recorded information has been reviewed and is accurate.   Filed Vitals:   10/22/13 1847 10/22/13 2109  BP: 143/122 141/101  Pulse: 91 76  Temp: 98 F (36.7 C)   TempSrc: Oral   Resp: 22 16  SpO2: 97% 97%     Antony MaduraKelly Bennett Vanscyoc, PA-C 10/22/13 2209

## 2013-10-25 NOTE — ED Provider Notes (Signed)
Medical screening examination/treatment/procedure(s) were performed by non-physician practitioner and as supervising physician I was immediately available for consultation/collaboration.   EKG Interpretation None       Sherria Riemann R. Thurmond Hildebran, MD 10/25/13 0708 

## 2015-08-18 DIAGNOSIS — G40909 Epilepsy, unspecified, not intractable, without status epilepticus: Secondary | ICD-10-CM

## 2015-08-18 HISTORY — DX: Epilepsy, unspecified, not intractable, without status epilepticus: G40.909

## 2015-10-13 ENCOUNTER — Encounter: Payer: Self-pay | Admitting: *Deleted

## 2015-10-14 ENCOUNTER — Telehealth: Payer: Self-pay | Admitting: *Deleted

## 2015-10-14 NOTE — Telephone Encounter (Signed)
Message For: OFFICE               Taken  1-JUN-17 at  9:48AM by DEF ------------------------------------------------------------ Ples Specteraller Brenda Allison              CID [id number]  Patient SAME                 [facility]    Area Code 336 Phone# [phone] * DOB [phone]      RE CXL 1030AM 6/2 APPT-WCB TO RESCHEDULE             FIRST APPT                                           Disp:Y/N N If Y = C/B If No Response In 20minutes ============================================================

## 2015-10-15 ENCOUNTER — Ambulatory Visit: Payer: BLUE CROSS/BLUE SHIELD | Admitting: Diagnostic Neuroimaging

## 2016-01-11 ENCOUNTER — Ambulatory Visit: Payer: BLUE CROSS/BLUE SHIELD | Admitting: Neurology
